# Patient Record
Sex: Male | Born: 1990 | Race: Black or African American | Hispanic: No | Marital: Single | Smoking: Current some day smoker
Health system: Southern US, Community
[De-identification: ages and names within clinical notes are randomized; demographics above are authoritative.]

## PROBLEM LIST (undated history)

## (undated) DIAGNOSIS — G8929 Other chronic pain: Secondary | ICD-10-CM

## (undated) HISTORY — PX: SKIN BIOPSY: SHX1

---
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
## 2013-09-11 ENCOUNTER — Emergency Department (HOSPITAL_BASED_OUTPATIENT_CLINIC_OR_DEPARTMENT_OTHER)
Admission: EM | Admit: 2013-09-11 | Discharge: 2013-09-11 | Disposition: A | Discharge status: Home / Self Care | Payer: Medicaid Other | Attending: Emergency Medicine | Admitting: Emergency Medicine

## 2013-09-11 ENCOUNTER — Encounter (HOSPITAL_BASED_OUTPATIENT_CLINIC_OR_DEPARTMENT_OTHER): Payer: Self-pay | Admitting: Emergency Medicine

## 2013-09-11 ENCOUNTER — Emergency Department (HOSPITAL_BASED_OUTPATIENT_CLINIC_OR_DEPARTMENT_OTHER): Payer: Medicaid Other

## 2013-09-11 DIAGNOSIS — F17200 Nicotine dependence, unspecified, uncomplicated: Secondary | ICD-10-CM | POA: Insufficient documentation

## 2013-09-11 DIAGNOSIS — R0602 Shortness of breath: Secondary | ICD-10-CM | POA: Insufficient documentation

## 2013-09-11 MED ORDER — ALBUTEROL SULFATE HFA 108 (90 BASE) MCG/ACT IN AERS
2.0000 | INHALATION_SPRAY | Freq: Once | RESPIRATORY_TRACT | Status: AC
  Administered 2013-09-11: 2 via RESPIRATORY_TRACT
  Filled 2013-09-11: qty 6.7

## 2013-09-11 NOTE — ED Provider Notes (Signed)
Medical screening examination/treatment/procedure(s) were performed by non-physician practitioner and as supervising physician I was immediately available for consultation/collaboration.     Geoffery Lyonsouglas Janeese Mcgloin, MD 09/11/13 2330

## 2013-09-11 NOTE — ED Provider Notes (Signed)
CSN: [id number]     Arrival date & time 09/11/13  1554 History   First MD Initiated Contact with Patient 09/11/13 1615     Chief Complaint  Patient presents with  . Shortness of Breath     (Consider location/radiation/quality/duration/timing/severity/associated sxs/prior Treatment) HPI Comments: Patient is a 23 year old male with no significant past medical history who presents to the emergency department for shortness of breath x5 months. Patient states that he feels most short of breath when he is exerting himself. 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 Patient states the shortness of breath is improved with rest. He denies associated fever, hemoptysis, cough, chest pain, nausea or vomiting, abdominal pain, numbness/tingling, weakness, recent surgeries or hospitalizations, prolonged travel, history of DVT/PE, and history of coagulopathies. Patient smokes blacken mild cigarettes x 1 year - 0.5 pack year history.  The history is provided by the patient. No language interpreter was used.    History reviewed. No pertinent past medical history. History reviewed. No pertinent past surgical history. History reviewed. No pertinent family history. History  Substance Use Topics  . Smoking status: Current Every Day Smoker -- 0.50 packs/day    Types: Cigarettes  . Smokeless tobacco: Not on file  . Alcohol Use: Yes    Review of Systems  Respiratory: Positive for shortness of breath.   All other systems reviewed and are negative.      Allergies  Review of patient's allergies indicates no known allergies.  Home Medications   Current Outpatient Rx  Name  Route  Sig  Dispense  Refill  . acetaminophen (TYLENOL) 500 MG tablet   Oral   Take 1,000 mg by mouth every 6 (six) hours as needed. For head ache         . EXPIRED: famotidine (PEPCID) 20 MG tablet   Oral   Take 1 tablet (20 mg total) by mouth 2 (two) times daily.   30 tablet   0   . PRESCRIPTION  MEDICATION   Oral   Take 1 tablet by mouth 2 (two) times daily. Antibiotic          BP 120/72  Pulse 76  Temp(Src) 98.2 F (36.8 C) (Oral)  Resp 16  Ht 6\' 1"  (1.854 m)  Wt 160 lb (72.576 kg)  BMI 21.11 kg/m2  SpO2 100%  Physical Exam  Nursing note and vitals reviewed. Constitutional: He is oriented to person, place, and time. He appears well-developed and well-nourished. No distress.  HENT:  Head: Normocephalic and atraumatic.  Eyes: Conjunctivae and EOM are normal. No scleral icterus.  Neck: Normal range of motion.  Cardiovascular: Normal rate, regular rhythm and normal heart sounds.   Pulmonary/Chest: Effort normal and breath sounds normal. No respiratory distress. He has no wheezes. He has no rales.  No retractions or accessory muscle use. Chest expansion is symmetric.  Abdominal: Soft. He exhibits no distension. There is no tenderness. There is no rebound and no guarding.  Musculoskeletal: Normal range of motion.  Neurological: He is alert and oriented to person, place, and time.  Skin: Skin is warm and dry. No rash noted. He is not diaphoretic. No erythema. No pallor.  Psychiatric: He has a normal mood and affect. His behavior is normal.    ED Course  Procedures (including critical care time) Labs Review Labs Reviewed - No data to display  Imaging Review Dg Chest 2 View  09/11/2013   CLINICAL DATA:  Shortness of Breath  EXAM: CHEST  2 VIEW  COMPARISON:  None.  FINDINGS: The lungs are clear. The heart size and pulmonary vascularity are normal. No pneumothorax. No adenopathy. No bone lesions.  IMPRESSION: No abnormality noted.   Electronically Signed   By: Bretta BangWilliam  Woodruff M.D.   On: 09/11/2013 16:53    EKG Interpretation   None       MDM   Final diagnoses:  Shortness of breath   23 year old male who smokes 0.5 packs per day presents to the emergency department for shortness of breath x5 months. Patient states that shortness of breath is worse with exertion  and improved with rest. He denies associated chest pain or hemarthrosis as well as syncope. Patient is well and nontoxic appearing, hemodynamically stable, and afebrile today. Lungs clear to auscultation bilaterally. He has symmetric chest expansion. No retractions or accessory muscle use noted. He ambulates in ED without hypoxia with oxygen saturations of 100% on room air.  Chest x-ray today is negative for focal consolidation or pneumonia, pneumothorax, pleural effusion, or other acute cardiopulmonary abnormality. Suspect that patient's symptoms may be secondary to smoking as he only began smoking one year ago. Doubt pulmonary embolism as patient without tachycardia, tachypnea, dyspnea, or hypoxia. He has PERC negative. Patient advised to quit smoking. He will be discharged today with an albuterol inhaler. Return precautions provided. Patient agreeable to plan with no unaddressed concerns.   Filed Vitals:   09/11/13 1601 09/11/13 1633  BP: 120/72   Pulse: 56 76  Temp: 98.2 F (36.8 C)   TempSrc: Oral   Resp: 16   Height: 6\' 1"  (1.854 m)   Weight: 160 lb (72.576 kg)   SpO2: 100% 100%       Antony MaduraKelly Daxtyn Rottenberg, PA-C 09/11/13 1719

## 2013-09-11 NOTE — ED Notes (Signed)
Pt c/o SOB x 5 months

## 2013-09-11 NOTE — ED Notes (Signed)
Pa  at bedside. 

## 2013-09-11 NOTE — Discharge Instructions (Signed)
Recommend that you discontinue smoking. Use albuterol inhaler, 2 puffs every 4 hours, as needed for shortness of breath. Followup with the pulmonologist, as needed, if symptoms persist or worsen.  Shortness of Breath Shortness of breath means you have trouble breathing. Shortness of breath needs medical care right away. HOME CARE   Do not smoke.  Avoid being around chemicals or things (paint fumes, dust) that may bother your breathing.  Rest as needed. Slowly begin your normal activities.  Only take medicines as told by your doctor.  Keep all doctor visits as told. GET HELP RIGHT AWAY IF:   Your shortness of breath gets worse.  You feel lightheaded, pass out (faint), or have a cough that is not helped by medicine.  You cough up blood.  You have pain with breathing.  You have pain in your chest, arms, shoulders, or belly (abdomen).  You have a fever.  You cannot walk up stairs or exercise the way you normally do.  You do not get better in the time expected.  You have a hard time doing normal activities even with rest.  You have problems with your medicines.  You have any new symptoms. MAKE SURE YOU:  Understand these instructions.  Will watch your condition.  Will get help right away if you are not doing well or get worse. Document Released: 12/27/2007 Document Revised: 01/09/2012 Document Reviewed: 09/25/2011 [facility] Patient Information 2014 [place], [place].

## 2013-09-22 ENCOUNTER — Ambulatory Visit (INDEPENDENT_AMBULATORY_CARE_PROVIDER_SITE_OTHER): Payer: Self-pay | Admitting: Internal Medicine

## 2013-09-22 ENCOUNTER — Encounter: Payer: Self-pay | Admitting: Internal Medicine

## 2013-09-22 VITALS — BP 112/64 | HR 85 | Temp 98.5°F | Ht 73.0 in | Wt 163.0 lb

## 2013-09-22 DIAGNOSIS — J45909 Unspecified asthma, uncomplicated: Secondary | ICD-10-CM

## 2013-09-22 DIAGNOSIS — F17200 Nicotine dependence, unspecified, uncomplicated: Secondary | ICD-10-CM

## 2013-09-22 MED ORDER — BUDESONIDE-FORMOTEROL FUMARATE 80-4.5 MCG/ACT IN AERO: INHALATION_SPRAY | RESPIRATORY_TRACT | Status: DC

## 2013-09-22 NOTE — Progress Notes (Signed)
   Subjective:    Patient ID: Larry Ross, male    DOB: Jul 15, 1991  MRN: [id number]  HPI  22 yobm smoker unemployed [facility]) with allergies all his life worse in April itching/sneezing self rx with otc but new symptoms of sob since Aug 2014 referred 09/22/2013 to pulmonary clinic  by ER where dx as asthma 09/11/13 and placed on pred taper and prn saba   09/22/2013 1st [facility] office visit/ Langdon Crosson  Chief Complaint  Patient presents with  . Pulmonary Consult    Self referral.  Pt states having DOE since Aug 2014. He gets SOB when plays sports and walking up hill. Using ventolin 1 to 2 times per day.     No obvious  patterns in day to day or daytime variabilty or assoc chronic cough or cp or chest tightness, subjective wheeze overt sinus or hb symptoms. No unusual exp hx or h/o childhood pna/ asthma or knowledge of premature birth.  Sleeping ok without nocturnal  or early am exacerbation  of respiratory  c/o's or need for noct saba. Also denies any obvious fluctuation of symptoms with weather or environmental changes or other aggravating or alleviating factors except as outlined above   Current Medications, Allergies, Complete Past Medical History, Past Surgical History, Family History, and Social History were [facility].           Review of Systems  Constitutional: Negative for fever, chills, activity change, appetite change and unexpected weight change.  HENT: Negative for congestion, dental problem, postnasal drip, rhinorrhea, sneezing, sore throat, trouble swallowing and voice change.   Eyes: Negative for visual disturbance.  Respiratory: Positive for shortness of breath. Negative for cough and choking.   Cardiovascular: Negative for chest pain and leg swelling.  Gastrointestinal: Negative for nausea, vomiting and abdominal pain.  Genitourinary: Negative for difficulty urinating.  Musculoskeletal: Negative for arthralgias.   Skin: Negative for rash.  Psychiatric/Behavioral: Negative for behavioral problems and confusion.       Objective:   Physical Exam  amb bm nad sp saba 2 h prior to OV    Wt Readings from Last 3 Encounters:  09/22/13 163 lb (73.936 kg)  09/11/13 160 lb (72.576 kg)     HEENT: nl dentition, turbinates, and orophanx. Nl external ear canals without cough reflex   NECK :  without JVD/Nodes/TM/ nl carotid upstrokes bilaterally   LUNGS: no acc muscle use, clear to A and P bilaterally without cough on insp or exp maneuvers   CV:  RRR  no s3 or murmur or increase in P2, no edema   ABD:  soft and nontender with nl excursion in the supine position. No bruits or organomegaly, bowel sounds nl  MS:  warm without deformities, calf tenderness, cyanosis or clubbing  SKIN: warm and dry without lesions    NEURO:  alert, approp, no deficits      CXR 09/11/13 No abnormality noted.           Assessment & Plan:

## 2013-09-22 NOTE — Patient Instructions (Signed)
Stop all smoking - breathe clean air   Symbicort 80 Take 2 puffs first thing in am and then another 2 puffs about 12 hours later and if back to normal and no need albuterol ok to reduce to 2 puffs in am  Only use your albuterol as a rescue medication to be used if you can't catch your breath by resting or doing a relaxed purse lip breathing pattern.  - The less you use it, the better it will work when you need it. - Ok to use up to 2 puffs  every 4 hours if you must but call for immediate appointment if use goes up over your usual need - Don't leave home without it !!  (think of it like the spare tire for your car)   Please schedule a follow up office visit in 4 weeks, sooner if needed

## 2013-09-23 DIAGNOSIS — J45909 Unspecified asthma, uncomplicated: Secondary | ICD-10-CM | POA: Insufficient documentation

## 2013-09-23 DIAGNOSIS — F17200 Nicotine dependence, unspecified, uncomplicated: Secondary | ICD-10-CM | POA: Insufficient documentation

## 2013-09-23 NOTE — Assessment & Plan Note (Signed)
I took an extended  opportunity with this patient to outline the consequences of continued cigarette use  in airway disorders based on all the data we have from the multiple national lung health studies (perfomed over decades at millions of dollars in cost)  indicating that smoking cessation, not choice of inhalers or physicians, is the most important aspect of care.   

## 2013-09-23 NOTE — Assessment & Plan Note (Addendum)
DDX of  difficult airways managment all start with A and  include Adherence, Ace Inhibitors, Acid Reflux, Active Sinus Disease, Alpha 1 Antitripsin deficiency, Anxiety masquerading as Airways dz,  ABPA,  allergy(esp in young), Aspiration (esp in elderly), Adverse effects of DPI,  Active smokers, plus two Bs  = Bronchiectasis and Beta blocker use..and one C= CHF  In this case Adherence is the biggest issue and starts with  inability to use HFA effectively and also  understand that SABA treats the symptoms but doesn't get to the underlying problem (inflammation).  I used  the analogy of putting steroid cream on a rash to help explain the meaning of topical therapy and the need to get the drug to the target tissue.    Active smoking the other big concern> discussed under sep a/p   The proper method of use, as well as anticipated side effects, of a metered-dose inhaler are discussed and demonstrated to the patient. Improved effectiveness after extensive coaching during this visit to a level of approximately  75%> Try on symbicort 80 2bid

## 2013-10-20 ENCOUNTER — Ambulatory Visit (INDEPENDENT_AMBULATORY_CARE_PROVIDER_SITE_OTHER): Payer: Self-pay | Admitting: Internal Medicine

## 2013-10-20 ENCOUNTER — Encounter: Payer: Self-pay | Admitting: Internal Medicine

## 2013-10-20 VITALS — BP 110/58 | HR 70 | Temp 98.7°F | Ht 73.0 in | Wt 158.8 lb

## 2013-10-20 DIAGNOSIS — F17200 Nicotine dependence, unspecified, uncomplicated: Secondary | ICD-10-CM

## 2013-10-20 DIAGNOSIS — J45909 Unspecified asthma, uncomplicated: Secondary | ICD-10-CM

## 2013-10-20 MED ORDER — BUDESONIDE-FORMOTEROL FUMARATE 80-4.5 MCG/ACT IN AERO: INHALATION_SPRAY | RESPIRATORY_TRACT | Status: DC

## 2013-10-20 NOTE — Patient Instructions (Addendum)
Try symbicort 80 2 pffs each am and leave off the pm puff if doing great, otherwise maintain on 2 puffs every 12 hours  The key is to stop smoking completely in all forms before smoking completely stops you!   Follow here is as needed or when prescriptions run out

## 2013-10-20 NOTE — Progress Notes (Signed)
Subjective:    Patient ID: Larry Ross, male    DOB: [birth year]  MRN: [id number]    Brief patient profile:  22 yobm smoker unemployed [facility]) with allergies all his life worse in April itching/sneezing self rx with otc but new symptoms of sob since Aug 2014 referred 09/22/2013 to pulmonary clinic  by ER where dx as asthma 09/11/13 and placed on pred taper and prn saba     History of Present Illness  09/22/2013 1st [facility] office visit/ W[facility]  Chief Complaint  Patient presents with  . Pulmonary Consult    Self referral.  Pt states having DOE since Aug 2014. He gets SOB when plays sports and walking up hill. Using ventolin 1 to 2 times per day.   rec  Stop all smoking - breathe clean air  Symbicort 80 Take 2 puffs first thing in am and then another 2 puffs about 12 hours later and if back to normal and no need albuterol ok to reduce to 2 puffs in am Only use your albuterol as a rescue medication      10/20/2013 f/u ov/[facility]ert re: symbicort 80 2bid / still smoking some Chief Complaint  Patient presents with  . Follow-up    Pt states breathing has improved since last OV. Denies cough, SOB and CP.   no need for saba at all    No obvious day to day or daytime variabilty or assoc chronic cough or cp or chest tightness, subjective wheeze overt sinus or hb symptoms. No unusual exp hx or h/o childhood pna/ asthma or knowledge of premature birth.  Sleeping ok without nocturnal  or early am exacerbation  of respiratory  c/o's or need for noct saba. Also denies any obvious fluctuation of symptoms with weather or environmental changes or other aggravating or alleviating factors except as outlined above   Current Medications, Allergies, Complete Past Medical History, Past Surgical History, Family History, and Social History were reviewed in [employer] record.  ROS  The following are not active complaints unless bolded sore throat, dysphagia, dental  problems, itching, sneezing,  nasal congestion or excess/ purulent secretions, ear ache,   fever, chills, sweats, unintended wt loss, pleuritic or exertional cp, hemoptysis,  orthopnea pnd or leg swelling, presyncope, palpitations, heartburn, abdominal pain, anorexia, nausea, vomiting, diarrhea  or change in bowel or urinary habits, change in stools or urine, dysuria,hematuria,  rash, arthralgias, visual complaints, headache, numbness weakness or ataxia or problems with walking or coordination,  change in mood/affect or memory.                        Objective:   Physical Exam  amb bm nad   Wt Readings from Last 3 Encounters:  10/20/13 158 lb 12.8 oz (72.031 kg)  09/22/13 163 lb (73.936 kg)  09/11/13 160 lb (72.576 kg)        HEENT: nl dentition, turbinates, and orophanx. Nl external ear canals without cough reflex   NECK :  without JVD/Nodes/TM/ nl carotid upstrokes bilaterally   LUNGS: no acc muscle use, clear to A and P bilaterally without cough on insp or exp maneuvers   CV:  RRR  no s3 or murmur or increase in P2, no edema   ABD:  soft and nontender with nl excursion in the supine position. No bruits or organomegaly, bowel sounds nl  MS:  warm without deformities, calf tenderness, cyanosis or clubbing  SKIN: warm and  dry without lesions           CXR 09/11/13 No abnormality noted.           Assessment & Plan:

## 2013-10-21 NOTE — Assessment & Plan Note (Signed)

## 2013-10-21 NOTE — Assessment & Plan Note (Signed)
All goals of chronic asthma control met including optimal function and elimination of symptoms with minimal need for rescue therapy.  Contingencies discussed in full including contacting this office immediately if not controlling the symptoms using the rule of two's.    

## 2016-07-11 ENCOUNTER — Emergency Department (HOSPITAL_BASED_OUTPATIENT_CLINIC_OR_DEPARTMENT_OTHER)
Admission: EM | Admit: 2016-07-11 | Discharge: 2016-07-11 | Disposition: A | Discharge status: Home / Self Care | Payer: Commercial Managed Care - HMO | Attending: Emergency Medicine | Admitting: Emergency Medicine

## 2016-07-11 ENCOUNTER — Encounter (HOSPITAL_BASED_OUTPATIENT_CLINIC_OR_DEPARTMENT_OTHER): Payer: Self-pay | Admitting: *Deleted

## 2016-07-11 DIAGNOSIS — L02811 Cutaneous abscess of head [any part, except face]: Secondary | ICD-10-CM

## 2016-07-11 DIAGNOSIS — F17210 Nicotine dependence, cigarettes, uncomplicated: Secondary | ICD-10-CM | POA: Diagnosis not present

## 2016-07-11 MED ORDER — PENTAFLUOROPROP-TETRAFLUOROETH EX AERO
INHALATION_SPRAY | CUTANEOUS | Status: AC
  Administered 2016-07-11: 30
  Filled 2016-07-11: qty 30

## 2016-07-11 MED ORDER — LIDOCAINE-EPINEPHRINE-TETRACAINE (LET) SOLUTION
3.0000 mL | Freq: Once | NASAL | Status: AC
  Administered 2016-07-11: 3 mL via TOPICAL
  Filled 2016-07-11: qty 3

## 2016-07-11 MED ORDER — DOXYCYCLINE HYCLATE 100 MG PO CAPS: 100.0000 mg | ORAL_CAPSULE | Freq: Two times a day (BID) | ORAL | 0 refills | Status: DC

## 2016-07-11 MED ORDER — NAPROXEN 500 MG PO TABS: 500.0000 mg | ORAL_TABLET | Freq: Two times a day (BID) | ORAL | 0 refills | Status: DC

## 2016-07-11 NOTE — ED Triage Notes (Signed)
Abscess to right rear head.  Multiple large abscesses noted.  [facility] to be [facility] without success.

## 2016-07-11 NOTE — Discharge Instructions (Signed)
Keep the area of your abscess covered to help it remain clean and dry. Take doxycycline as prescribed until finished. You may take naproxen as needed for pain. Follow-up with your specialist to ensure proper healing and resolution of symptoms. You may return as needed for new or concerning symptoms.

## 2016-07-11 NOTE — ED Notes (Signed)
ED Provider at bedside. 

## 2016-07-11 NOTE — ED Provider Notes (Signed)
[facility]   CSN: [id number] Arrival date & time: 07/11/16  1937  By signing my name below, I, Larry Ross, attest that this documentation has been prepared under the direction and in the presence of  TRW AutomotiveKelly Bynum Mccullars, PA-C. Electronically Signed: Clovis PuAvnee Ross, ED Scribe. 07/11/16. 9:10 PM.   History   Chief Complaint Chief Complaint  Patient presents with  . Abscess     The history is provided by the patient.  No language interpreter was used.   HPI Comments:  Larry Ross is a 25 y.o. male who presents to the Emergency Department complaining of a worsening abscess to the left side of his posterior head x 1-2 weeks. Pt states the lump was originally about quarter sized. He also reports a headache. Pt states he visited Urgent Care today where they tried to numb it with no relief. He has also applied a warm rag with no relief. Pt denies fevers, any other associated symptoms and any other modifying factors at this time. Pt states he had an abscess to his scalp in October where he saw a specialist who performed an I&D.    History reviewed. No pertinent past medical history.   Patient Active Problem List   Diagnosis Date Noted  . 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 Smoker 09/23/2013    History reviewed. No pertinent surgical history.   Home Medications    Prior to Admission medications   Medication Sig Start Date End Date Taking? Authorizing Provider  albuterol (VENTOLIN HFA) 108 (90 BASE) MCG/ACT inhaler Inhale 2 puffs into the lungs every 4 (four) hours as needed for wheezing or shortness of breath.    Historical Provider, MD  budesonide-formoterol (SYMBICORT) 80-4.5 MCG/ACT inhaler Take 2 puffs first thing in am and then another 2 puffs about 12 hours later. 10/20/13   Nyoka CowdenMichael B Wert, MD  doxycycline (VIBRAMYCIN) 100 MG capsule Take 1 capsule (100 mg total) by mouth 2 (two) times daily. 07/11/16   Antony MaduraKelly Chelsei Mcchesney, PA-C  naproxen (NAPROSYN) 500 MG tablet Take  1 tablet (500 mg total) by mouth 2 (two) times daily. 07/11/16   Antony MaduraKelly Talli Kimmer, PA-C    Family History Family History  Problem Relation Age of Onset  . Asthma Neg Hx     Social History Social History  Substance Use Topics  . Smoking status: Current Some Day Smoker    Packs/day: 0.25    Years: 0.50    Types: Cigarettes  . Smokeless tobacco: Never Used     Comment: hookah occ   . Alcohol use No     Allergies   Patient has no known allergies.   Review of Systems Review of Systems 10 systems reviewed and all are negative for acute change except as noted in the HPI.   Physical Exam Updated Vital Signs BP 115/69 (BP Location: Right Arm)   Pulse 66   Temp 98.1 F (36.7 C) (Oral)   Resp 16   Ht 6\' 1"  (1.854 m)   Wt 76.7 kg   SpO2 99%   BMI 22.30 kg/m   Physical Exam  Constitutional: He is oriented to person, place, and time. He appears well-developed and well-nourished. No distress.  Nontoxic and in NAD  HENT:  Head: Normocephalic and atraumatic.    Eyes: Conjunctivae and EOM are normal. No scleral icterus.  Neck: Normal range of motion.  No nuchal rigidity or meningismus  Pulmonary/Chest: Effort normal. No respiratory distress.  Respirations even and unlabored  Musculoskeletal: Normal range of  motion.  Neurological: He is alert and oriented to person, place, and time. He exhibits normal muscle tone. Coordination normal.  Skin: Skin is warm and dry. No rash noted. He is not diaphoretic. No erythema. No pallor.  Psychiatric: He has a normal mood and affect. His behavior is normal.  Nursing note and vitals reviewed.    ED Treatments / Results  DIAGNOSTIC STUDIES:  Oxygen Saturation is 100% on RA, normal by my interpretation.    COORDINATION OF CARE:  9:09 PM Discussed treatment plan with pt at bedside and pt agreed to plan.       Labs (all labs ordered are listed, but only abnormal results are displayed) Labs Reviewed - No data to display  EKG   EKG Interpretation None       Radiology No results found.  Procedures Procedures (including critical care time)  Medications Ordered in ED Medications  lidocaine-EPINEPHrine-tetracaine (LET) solution (3 mLs Topical Given 07/11/16 2156)  pentafluoroprop-tetrafluoroeth (GEBAUERS) aerosol (30 application  Given 07/11/16 2156)    INCISION AND DRAINAGE Performed by: Antony MaduraHUMES, Brieonna Crutcher Consent: Verbal consent obtained. Risks and benefits: risks, benefits and alternatives were discussed Type: abscess  Body area: left parietal scalp  Anesthesia: local infiltration  Incision was made with a scalpel.  Local anesthetic: topical LET  Anesthetic total: 3 ml  Complexity: complex Blunt dissection to break up loculations  Drainage: purulent and bloody  Drainage amount: small  Packing material: none  Patient tolerance: Patient tolerated the procedure well with no immediate complications.     Initial Impression / Assessment and Plan / ED Course  I have reviewed the triage vital signs and the nursing notes.  Pertinent labs & imaging results that were available during my care of the patient were reviewed by me and considered in my medical decision making (see chart for details).  Clinical Course     Patient with skin abscess amenable to incision and drainage. Suspect infected sebaceous cyst. Abscess was not large enough to warrant packing or drain. Wound recheck in 2 days advised. Encouraged home warm soaks and flushing. Mild signs of cellulitis is surrounding skin. No nuchal rigidity or meningismus. Will d/c to home with Rx for doxycycline given sensitive location of abscess. Return precautions discussed and provided. Patient discharged in stable condition with no unaddressed concerns.   Final Clinical Impressions(s) / ED Diagnoses   Final diagnoses:  Abscess, scalp    New Prescriptions Discharge Medication List as of 07/11/2016 10:42 PM    START taking these medications    Details  doxycycline (VIBRAMYCIN) 100 MG capsule Take 1 capsule (100 mg total) by mouth 2 (two) times daily., Starting Tue 07/11/2016, Print    naproxen (NAPROSYN) 500 MG tablet Take 1 tablet (500 mg total) by mouth 2 (two) times daily., Starting Tue 07/11/2016, Print        I personally performed the services described in this documentation, which was scribed in my presence. The recorded information has been reviewed and is accurate.      Antony MaduraKelly Seyed Heffley, PA-C 07/11/16 2323  Melene Planan Floyd, DO 07/12/16 2121

## 2017-06-11 ENCOUNTER — Other Ambulatory Visit: Payer: Self-pay

## 2017-06-11 ENCOUNTER — Encounter (HOSPITAL_BASED_OUTPATIENT_CLINIC_OR_DEPARTMENT_OTHER): Payer: Self-pay | Admitting: *Deleted

## 2017-06-11 ENCOUNTER — Emergency Department (HOSPITAL_BASED_OUTPATIENT_CLINIC_OR_DEPARTMENT_OTHER)
Admission: EM | Admit: 2017-06-11 | Discharge: 2017-06-11 | Disposition: A | Discharge status: Home / Self Care | Payer: 59 | Attending: Emergency Medicine | Admitting: Emergency Medicine

## 2017-06-11 DIAGNOSIS — F17210 Nicotine dependence, cigarettes, uncomplicated: Secondary | ICD-10-CM | POA: Diagnosis not present

## 2017-06-11 DIAGNOSIS — M5431 Sciatica, right side: Secondary | ICD-10-CM | POA: Insufficient documentation

## 2017-06-11 DIAGNOSIS — M545 Low back pain: Secondary | ICD-10-CM | POA: Diagnosis present

## 2017-06-11 DIAGNOSIS — Z79899 Other long term (current) drug therapy: Secondary | ICD-10-CM | POA: Diagnosis not present

## 2017-06-11 DIAGNOSIS — J45998 Other asthma: Secondary | ICD-10-CM | POA: Diagnosis not present

## 2017-06-11 MED ORDER — KETOROLAC TROMETHAMINE 30 MG/ML IJ SOLN
30.0000 mg | Freq: Once | INTRAMUSCULAR | Status: AC
Start: 2017-06-11 — End: 2017-06-11
  Administered 2017-06-11: 30 mg via INTRAMUSCULAR
  Filled 2017-06-11: qty 1

## 2017-06-11 MED ORDER — DEXAMETHASONE SODIUM PHOSPHATE 10 MG/ML IJ SOLN
10.0000 mg | Freq: Once | INTRAMUSCULAR | Status: AC
  Administered 2017-06-11: 10 mg via INTRAMUSCULAR
  Filled 2017-06-11: qty 1

## 2017-06-11 MED ORDER — IBUPROFEN 800 MG PO TABS: 800.0000 mg | ORAL_TABLET | Freq: Three times a day (TID) | ORAL | 0 refills | Status: DC

## 2017-06-11 MED ORDER — CYCLOBENZAPRINE HCL 10 MG PO TABS: 5.0000 mg | ORAL_TABLET | Freq: Two times a day (BID) | ORAL | 0 refills | Status: DC | PRN

## 2017-06-11 NOTE — ED Provider Notes (Signed)
[facility] EMERGENCY DEPARTMENT Provider Note   CSN: [id number] Arrival date & time: 06/11/17  1233   History   Chief Complaint Chief Complaint  Patient presents with  . Back Pain    HPI Larry Ross is a 26 y.o. male.  HPI   Patient to the ER with complaints of right sided back/hip pain. He was working out doing low back and leg work outs when the next morning his hip/low back was sore. It has progressively worsened to the point where it is hard for him to pend over to pull up or down his pants due to pain. The pain goes from the low back and down right behind his right knee. He has not tried anything at home. Denies any specific injury. Denies fevers, hx of IDU, change in bowel or urine control. Denies weakness or numbness.  History reviewed. No pertinent past medical history.  Patient Active Problem List   Diagnosis Date Noted  . 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 Smoker 09/23/2013    History reviewed. No pertinent surgical history.     Home Medications    Prior to Admission medications   Medication Sig Start Date End Date Taking? Authorizing Provider  albuterol (VENTOLIN HFA) 108 (90 BASE) MCG/ACT inhaler Inhale 2 puffs into the lungs every 4 (four) hours as needed for wheezing or shortness of breath.    [provider]  budesonide-formoterol (SYMBICORT) 80-4.5 MCG/ACT inhaler Take 2 puffs first thing in am and then another 2 puffs about 12 hours later. 10/20/13   Nyoka CowdenWert, Michael B, MD  cyclobenzaprine (FLEXERIL) 10 MG tablet Take 0.5-1 tablets (5-10 mg total) 2 (two) times daily as needed by mouth for muscle spasms. 06/11/17   Marlon PelGreene, Deryn Massengale, PA-C  doxycycline (VIBRAMYCIN) 100 MG capsule Take 1 capsule (100 mg total) by mouth 2 (two) times daily. 07/11/16   Antony MaduraHumes, Kelly, PA-C  ibuprofen (ADVIL,MOTRIN) 800 MG tablet Take 1 tablet (800 mg total) 3 (three) times daily by mouth. 06/11/17   Neva SeatGreene, Rhyatt Muska, PA-C  naproxen (NAPROSYN) 500 MG tablet Take 1  tablet (500 mg total) by mouth 2 (two) times daily. 07/11/16   Antony MaduraHumes, Kelly, PA-C    Family History Family History  Problem Relation Age of Onset  . Asthma Neg Hx     Social History Social History   Tobacco Use  . Smoking status: Current Some Day Smoker    Packs/day: 0.25    Years: 0.50    Pack years: 0.12    Types: Cigarettes  . Smokeless tobacco: Never Used  . Tobacco comment: hookah occ   Substance Use Topics  . Alcohol use: No  . Drug use: No     Allergies   Patient has no known allergies.   Review of Systems Review of Systems Negative ROS aside from pertinent positives and negatives as listed in HPI   Physical Exam Updated Vital Signs BP (!) 100/56   Pulse 87   Temp 98.3 F (36.8 C) (Oral)   Resp 16   Ht 6\' 1"  (1.854 m)   Wt 74.8 kg (165 lb)   SpO2 99%   BMI 21.77 kg/m   Physical Exam  Constitutional: He appears well-developed and well-nourished. No distress.  HENT:  Head: Normocephalic and atraumatic.  Eyes: Pupils are equal, round, and reactive to light.  Neck: Normal range of motion. Neck supple.  Cardiovascular: Normal rate and regular rhythm.  Pulmonary/Chest: Effort normal.  Abdominal: Soft.  Musculoskeletal:       Lumbar  back: He exhibits decreased range of motion, tenderness, pain and spasm.       Back:  Reflexes are physiologic and symmetrical bilaterally.  +normal gait  Neurological: He is alert.  Skin: Skin is warm and dry.  Nursing note and vitals reviewed.   ED Treatments / Results  Labs (all labs ordered are listed, but only abnormal results are displayed) Labs Reviewed - No data to display  EKG  EKG Interpretation None       Radiology No results found.  Procedures Procedures (including critical care time)  Medications Ordered in ED Medications  ketorolac (TORADOL) 30 MG/ML injection 30 mg (not administered)  dexamethasone (DECADRON) injection 10 mg (not administered)     Initial Impression / Assessment  and Plan / ED Course  I have reviewed the triage vital signs and the nursing notes.  Pertinent labs & imaging results that were available during my care of the patient were reviewed by me and considered in my medical decision making (see chart for details).     No red flags concerning patient's back pain. No s/s of central cord compression or cauda equina. Lower extremities are neurovascularly intact and patient is ambulating without difficulty.  No red flag s/s of low back pain. Patient was counseled on back pain precautions and told to do activity as tolerated but do not lift, push, or pull heavy objects more than 10 pounds for the next week. Patient counseled to use ice or heat on back for no longer than 15 minutes every hour.   Patient prescribed muscle relaxer and counseled on proper use of muscle relaxant medication.  Urged patient not to drink alcohol, drive, or perform any other activities that requires focus while taking either of these medications.   Patient urged to follow-up with PCP if pain does not improve with treatment and rest or if pain becomes recurrent. Urged to return with worsening severe pain, loss of bowel or bladder control, trouble walking.   The patient verbalizes understanding and agrees with the plan.   Final Clinical Impressions(s) / ED Diagnoses   Final diagnoses:  Sciatica of right side    ED Discharge Orders        Ordered    cyclobenzaprine (FLEXERIL) 10 MG tablet  2 times daily PRN     06/11/17 1251    ibuprofen (ADVIL,MOTRIN) 800 MG tablet  3 times daily     06/11/17 1251       Marlon PelGreene, Mashelle Busick, PA-C 06/11/17 1258    Margarita Grizzleay, Danielle, MD 06/11/17 [phone]

## 2017-06-11 NOTE — ED Triage Notes (Signed)
2 weeks ago he was working out and has been stiff in his lower back and into is right leg since.

## 2018-03-01 ENCOUNTER — Emergency Department (HOSPITAL_BASED_OUTPATIENT_CLINIC_OR_DEPARTMENT_OTHER)
Admission: EM | Admit: 2018-03-01 | Discharge: 2018-03-01 | Disposition: A | Discharge status: Home / Self Care | Payer: 59 | Attending: Emergency Medicine | Admitting: Emergency Medicine

## 2018-03-01 ENCOUNTER — Other Ambulatory Visit: Payer: Self-pay

## 2018-03-01 DIAGNOSIS — Z79899 Other long term (current) drug therapy: Secondary | ICD-10-CM | POA: Diagnosis not present

## 2018-03-01 DIAGNOSIS — M79605 Pain in left leg: Secondary | ICD-10-CM | POA: Diagnosis present

## 2018-03-01 DIAGNOSIS — M5432 Sciatica, left side: Secondary | ICD-10-CM | POA: Diagnosis not present

## 2018-03-01 DIAGNOSIS — J45909 Unspecified asthma, uncomplicated: Secondary | ICD-10-CM | POA: Insufficient documentation

## 2018-03-01 DIAGNOSIS — F17210 Nicotine dependence, cigarettes, uncomplicated: Secondary | ICD-10-CM | POA: Insufficient documentation

## 2018-03-01 MED ORDER — DEXAMETHASONE SODIUM PHOSPHATE 10 MG/ML IJ SOLN
10.0000 mg | Freq: Once | INTRAMUSCULAR | Status: AC
  Administered 2018-03-01: 10 mg via INTRAMUSCULAR
  Filled 2018-03-01: qty 1

## 2018-03-01 MED ORDER — OXYCODONE-ACETAMINOPHEN 5-325 MG PO TABS
1.0000 | ORAL_TABLET | Freq: Once | ORAL | Status: AC
  Administered 2018-03-01: 1 via ORAL
  Filled 2018-03-01: qty 1

## 2018-03-01 MED ORDER — TRAMADOL HCL 50 MG PO TABS: 50.0000 mg | ORAL_TABLET | Freq: Four times a day (QID) | ORAL | 0 refills | Status: DC | PRN

## 2018-03-01 MED ORDER — PREDNISONE 10 MG (21) PO TBPK: ORAL_TABLET | ORAL | 0 refills | Status: AC

## 2018-03-01 MED ORDER — IBUPROFEN 800 MG PO TABS: 800.0000 mg | ORAL_TABLET | Freq: Four times a day (QID) | ORAL | 0 refills | Status: DC | PRN

## 2018-03-01 NOTE — ED Triage Notes (Signed)
Pt reports 10/10 left leg pain that starts in his buttocks and radiates down his leg. Pt has hx of right leg sciatica. Pt denies recent injury or fall.

## 2018-03-01 NOTE — ED Provider Notes (Signed)
[facility] EMERGENCY DEPARTMENT Provider Note   CSN: [id number] Arrival date & time: 03/01/18  0359     History   Chief Complaint Chief Complaint  Patient presents with  . Leg Pain    Left    HPI Larry Ross is a 27 y.o. male.  Patient presents to the emergency department for evaluation of leg and back pain.  Patient reports that symptoms began 2 or 3 days ago.  Pain started as an aching pain behind the left hip and in the lower back.  He is now expensing pain radiating down his leg.  Pain worsens with movement at the hip.  He denies any injury.  He has had similar pain on the right side, treated as sciatica.  No loss of bowel or bladder function.  No numbness, tingling or weakness of the lower extremities.     No past medical history on file.  Patient Active Problem List   Diagnosis Date Noted  . 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 Smoker 09/23/2013    No past surgical history on file.      Home Medications    Prior to Admission medications   Medication Sig Start Date End Date Taking? Authorizing Provider  albuterol (VENTOLIN HFA) 108 (90 BASE) MCG/ACT inhaler Inhale 2 puffs into the lungs every 4 (four) hours as needed for wheezing or shortness of breath.    [provider]  budesonide-formoterol (SYMBICORT) 80-4.5 MCG/ACT inhaler Take 2 puffs first thing in am and then another 2 puffs about 12 hours later. 10/20/13   Nyoka Cowden, MD  ibuprofen (ADVIL,MOTRIN) 800 MG tablet Take 1 tablet (800 mg total) by mouth every 6 (six) hours as needed. 03/01/18   Gilda Crease, MD  predniSONE (STERAPRED UNI-PAK 21 TAB) 10 MG (21) TBPK tablet Take 6 tablets (60 mg total) by mouth daily for 1 day, THEN 5 tablets (50 mg total) daily for 1 day, THEN 4 tablets (40 mg total) daily for 1 day, THEN 3 tablets (30 mg total) daily for 1 day, THEN 2 tablets (20 mg total) daily for 1 day, THEN 1 tablet (10 mg total) daily for 1 day. 03/01/18 03/07/18  Gilda Crease, MD  traMADol (ULTRAM) 50 MG tablet Take 1 tablet (50 mg total) by mouth every 6 (six) hours as needed. 03/01/18   Gilda Crease, MD    Family History Family History  Problem Relation Age of Onset  . Asthma Neg Hx     Social History Social History   Tobacco Use  . Smoking status: Current Some Day Smoker    Packs/day: 0.25    Years: 0.50    Pack years: 0.12    Types: Cigarettes  . Smokeless tobacco: Never Used  . Tobacco comment: hookah occ   Substance Use Topics  . Alcohol use: No  . Drug use: No     Allergies   Patient has no known allergies.   Review of Systems Review of Systems  Musculoskeletal: Positive for back pain.  All other systems reviewed and are negative.    Physical Exam Updated Vital Signs BP 137/82 (BP Location: Right Arm)   Pulse 65   Temp 98.2 F (36.8 C) (Oral)   Resp 16   SpO2 100%   Physical Exam  Constitutional: He appears well-developed and well-nourished.  HENT:  Head: Atraumatic.  Pulmonary/Chest: Effort normal.  Musculoskeletal: He exhibits no edema or deformity.       Left hip: He exhibits  decreased range of motion (increased pain with flexion at hip). He exhibits no tenderness.       Lumbar back: He exhibits tenderness.       Back:  Neurological:  Reflex Scores:      Patellar reflexes are 1+ on the right side and 1+ on the left side.    ED Treatments / Results  Labs (all labs ordered are listed, but only abnormal results are displayed) Labs Reviewed - No data to display  EKG None  Radiology No results found.  Procedures Procedures (including critical care time)  Medications Ordered in ED Medications  dexamethasone (DECADRON) injection 10 mg (has no administration in time range)  oxyCODONE-acetaminophen (PERCOCET/ROXICET) 5-325 MG per tablet 1 tablet (has no administration in time range)     Initial Impression / Assessment and Plan / ED Course  I have reviewed the triage vital signs and  the nursing notes.  Pertinent labs & imaging results that were available during my care of the patient were reviewed by me and considered in my medical decision making (see chart for details).     Patient presents to the ER with musculoskeletal back pain. Examination reveals back tenderness without any associated neurologic findings. Patient's strength, sensation and reflexes were normal. There is no evidence of saddle anesthesia. Patient does not have a foot drop. Patient has not experienced any change in bowel or bladder function. As such, patient did not require any imaging or further studies. Patient was treated with analgesia.  Final Clinical Impressions(s) / ED Diagnoses   Final diagnoses:  Sciatica of left side    ED Discharge Orders         Ordered    traMADol (ULTRAM) 50 MG tablet  Every 6 hours PRN     03/01/18 0423    ibuprofen (ADVIL,MOTRIN) 800 MG tablet  Every 6 hours PRN     03/01/18 0423    predniSONE (STERAPRED UNI-PAK 21 TAB) 10 MG (21) TBPK tablet     03/01/18 0423           Gilda CreasePollina, Elaisha Zahniser J, MD 03/01/18 [phone]

## 2018-03-09 ENCOUNTER — Other Ambulatory Visit: Payer: Self-pay

## 2018-03-09 ENCOUNTER — Encounter (HOSPITAL_BASED_OUTPATIENT_CLINIC_OR_DEPARTMENT_OTHER): Payer: Self-pay | Admitting: Emergency Medicine

## 2018-03-09 ENCOUNTER — Emergency Department (HOSPITAL_BASED_OUTPATIENT_CLINIC_OR_DEPARTMENT_OTHER)
Admission: EM | Admit: 2018-03-09 | Discharge: 2018-03-09 | Disposition: A | Discharge status: Home / Self Care | Payer: 59 | Attending: Emergency Medicine | Admitting: Emergency Medicine

## 2018-03-09 DIAGNOSIS — G8929 Other chronic pain: Secondary | ICD-10-CM

## 2018-03-09 DIAGNOSIS — Z79899 Other long term (current) drug therapy: Secondary | ICD-10-CM | POA: Insufficient documentation

## 2018-03-09 DIAGNOSIS — J45909 Unspecified asthma, uncomplicated: Secondary | ICD-10-CM | POA: Diagnosis not present

## 2018-03-09 DIAGNOSIS — M5442 Lumbago with sciatica, left side: Secondary | ICD-10-CM | POA: Diagnosis not present

## 2018-03-09 DIAGNOSIS — M545 Low back pain: Secondary | ICD-10-CM | POA: Diagnosis present

## 2018-03-09 DIAGNOSIS — F17210 Nicotine dependence, cigarettes, uncomplicated: Secondary | ICD-10-CM | POA: Diagnosis not present

## 2018-03-09 MED ORDER — PREDNISONE 10 MG PO TABS: 40.0000 mg | ORAL_TABLET | Freq: Every day | ORAL | 0 refills | Status: AC

## 2018-03-09 MED ORDER — CYCLOBENZAPRINE HCL 10 MG PO TABS: 10.0000 mg | ORAL_TABLET | Freq: Two times a day (BID) | ORAL | 0 refills | Status: DC | PRN

## 2018-03-09 NOTE — ED Provider Notes (Signed)
[facility] EMERGENCY DEPARTMENT Provider Note   CSN: [id number] Arrival date & time: 03/09/18  1117     History   Chief Complaint Chief Complaint  Patient presents with  . Back Pain    HPI Larry Ross is a 27 y.o. male here for evaluation of back pain that he attributes to his sciatica.  Pain began 2 days ago.  Pain is moderate, sometimes severe with movement and palpation.  Pain begins at the top of his left buttocks and radiates down the posterior aspect of his leg to about the knee.  Exacerbated with movement, sitting down, bending at the waist.  Associated with intermittent tingling down his leg with weightbearing.  Patient states that this pain is just like his previous bouts of sciatica.  States he does a lot of exertional activity and lifting at work but does not remember any specific injury leading up to the symptoms.  No recent falls.  Was seen in the ER on 8/9 for the same and was discharged with prednisone and tramadol, states that these medicines helped but the pain just came right back.  He denies any fevers, chills, abdominal pain, urinary symptoms, changes in bowel movements, groin numbness, loss of bladder or bowel control, loss of sensation, heaviness or weakness to his extremities.  No IV drug use.  No interventions. No alleviating factors.   HPI  History reviewed. No pertinent past medical history.  Patient Active Problem List   Diagnosis Date Noted  . 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 Smoker 09/23/2013    History reviewed. No pertinent surgical history.      Home Medications    Prior to Admission medications   Medication Sig Start Date End Date Taking? Authorizing Provider  albuterol (VENTOLIN HFA) 108 (90 BASE) MCG/ACT inhaler Inhale 2 puffs into the lungs every 4 (four) hours as needed for wheezing or shortness of breath.    [provider]  budesonide-formoterol (SYMBICORT) 80-4.5 MCG/ACT inhaler Take 2 puffs first thing in am and  then another 2 puffs about 12 hours later. 10/20/13   Nyoka CowdenWert, Michael B, MD  cyclobenzaprine (FLEXERIL) 10 MG tablet Take 1 tablet (10 mg total) by mouth 2 (two) times daily as needed for muscle spasms. 03/09/18   Liberty HandyGibbons, Inza Mikrut J, PA-C  ibuprofen (ADVIL,MOTRIN) 800 MG tablet Take 1 tablet (800 mg total) by mouth every 6 (six) hours as needed. 03/01/18   Gilda CreasePollina, Christopher J, MD  predniSONE (DELTASONE) 10 MG tablet Take 4 tablets (40 mg total) by mouth daily for 5 days. 03/09/18 03/14/18  Liberty HandyGibbons, Mandrell Vangilder J, PA-C  traMADol (ULTRAM) 50 MG tablet Take 1 tablet (50 mg total) by mouth every 6 (six) hours as needed. 03/01/18   Gilda CreasePollina, Christopher J, MD    Family History Family History  Problem Relation Age of Onset  . Asthma Neg Hx     Social History Social History   Tobacco Use  . Smoking status: Current Some Day Smoker    Packs/day: 0.25    Years: 0.50    Pack years: 0.12    Types: Cigarettes  . Smokeless tobacco: Never Used  . Tobacco comment: hookah occ   Substance Use Topics  . Alcohol use: No  . Drug use: No     Allergies   Patient has no known allergies.   Review of Systems Review of Systems  Musculoskeletal: Positive for back pain.  All other systems reviewed and are negative.    Physical Exam Updated Vital Signs BP  110/75 (BP Location: Right Arm)   Pulse 81   Temp 98.5 F (36.9 C) (Oral)   Resp 18   Ht 6' (1.829 m)   Wt 72.6 kg   SpO2 100%   BMI 21.70 kg/m   Physical Exam  Constitutional: He appears well-developed and well-nourished.  HENT:  Head: Normocephalic and atraumatic.  Nose: Nose normal.  Eyes: EOM are normal.  Neck: Normal range of motion. Neck supple.  No midline cervical spine tenderness  Cardiovascular: Normal rate, S1 normal, S2 normal and normal heart sounds.  Pulses:      Radial pulses are 2+ on the right side, and 2+ on the left side.       Dorsalis pedis pulses are 2+ on the right side, and 2+ on the left side.  Pulmonary/Chest:  Effort normal and breath sounds normal. He has no decreased breath sounds.  Abdominal: Soft. Normal appearance and bowel sounds are normal. There is no tenderness.  No suprapubic or CVA tenderness   Musculoskeletal: He exhibits tenderness.       Lumbar back: He exhibits tenderness and pain.  No T spine midline tenderness No L spine midline tenderness No T/L spine paraspinal muscular tenderness or increased tone  +R sciatic notches tenderness, diffuse LEFT buttock tenderness. Positive LEFT SLR. Positive contralateral SLR. Negative Pearlean Brownie and Stinchfield tests bilaterally  Neurological:  5/5 strength with flexion/extension of hip, knee and ankle, bilaterally.  Sensation to light touch intact in lower extremities including feet  Skin: Skin is warm and dry. Capillary refill takes less than 2 seconds.  Psychiatric: He has a normal mood and affect. His behavior is normal. Judgment and thought content normal.     ED Treatments / Results  Labs (all labs ordered are listed, but only abnormal results are displayed) Labs Reviewed - No data to display  EKG None  Radiology No results found.  Procedures Procedures (including critical care time)  Medications Ordered in ED Medications - No data to display   Initial Impression / Assessment and Plan / ED Course  I have reviewed the triage vital signs and the nursing notes.  Pertinent labs & imaging results that were available during my care of the patient were reviewed by me and considered in my medical decision making (see chart for details).     Patient with a hx of back pain here with flare of sciatica.  States symptoms today similar to previous sciatica.  Seen on 8/9 in ER for same.    Musculoskeletal exam revealed reproducible tenderness @ left sciatic notch, positive SLR suggesting MSK/radicular etiology. Abdominal exam benign, without pulsatility, suprapubic or CVA tenderness. Distal pulses symmetric bilaterally. No focal neurological  deficits. No overlaying rash. Considered UTI/pyelo, kidney stone, cauda equina, epidural abscess, dissection considered but these don't fit clinical picture. No red flag features of back pain present such as saddle anesthesia, bladder/bowel incontinence or retention, fevers, h/o cancer, IVDU, preceding trauma or falls, unilateral weakness, urinary symptoms. Emergent lab/imaging not thought to be indicated today as physical exam reassuring.  Conservative measures such as ice/heat, mild stretches, muscle relaxer and high dose NSAIDs indicated with PCP follow-up if no improvement with conservative management. ED return precautions discussed with patient who verbalized understanding and is agreeable to plan. Pt has frequent ER visits for back pain. Discussed importance of f/u with ortho as recurrent use of prednisone, NSAID not recommended.    Final Clinical Impressions(s) / ED Diagnoses   Final diagnoses:  Chronic left-sided low back pain with  left-sided sciatica    ED Discharge Orders         Ordered    predniSONE (DELTASONE) 10 MG tablet  Daily     03/09/18 1229    cyclobenzaprine (FLEXERIL) 10 MG tablet  2 times daily PRN     03/09/18 1229           Liberty HandyGibbons, Pawan Knechtel J, PA-C 03/09/18 1236    Loren RacerYelverton, David, MD 03/12/18 1721

## 2018-03-09 NOTE — ED Triage Notes (Signed)
Patient states " my sciatic nerve is acting up real bad" - patient states that he has left lower back pain

## 2018-03-09 NOTE — Discharge Instructions (Addendum)
Given your symptoms and physical exam findings, I suspect you have muscular spasm due to overuse/recent activity and/or sciatic nerve inflammation.   We will treat your inflammation with the following medication regimen: Prednisone 40 mg daily x 5 days Flexeril 10 mg x 5 days Ibuprofen 600 mg + Acetaminophen 1000 mg every 8 hours for the next 5 days Heating pad as needed Over the counter lidocaine patches (salonpas) can be helpful  You need follow up with orthopedist for chronic back pain and sciatica.  Recurrent use of ibuprofen, acetaminophen, prednisone is not recommended and orthopedist can offer different treatment options.   Avoid any exacerbating activities for the next 48 hours.  After 48 hours, start doing light back range of motion exercises and walking to avoid worsening back stiffness.   Return for fevers, chills, abdominal pain, changes in bowel movement, urinary symptoms, groin numbness, loss of bladder or bowel control, numbness weakness or heaviness to your extremities, rash.

## 2018-03-18 ENCOUNTER — Ambulatory Visit (HOSPITAL_BASED_OUTPATIENT_CLINIC_OR_DEPARTMENT_OTHER)
Admission: RE | Admit: 2018-03-18 | Discharge: 2018-03-18 | Disposition: A | Discharge status: Home / Self Care | Payer: 59 | Source: Ambulatory Visit | Attending: Family Medicine | Admitting: Family Medicine

## 2018-03-18 ENCOUNTER — Ambulatory Visit (INDEPENDENT_AMBULATORY_CARE_PROVIDER_SITE_OTHER): Payer: 59 | Admitting: Family Medicine

## 2018-03-18 ENCOUNTER — Encounter: Payer: Self-pay | Admitting: Family Medicine

## 2018-03-18 VITALS — BP 117/73 | HR 103 | Ht 73.0 in | Wt 160.0 lb

## 2018-03-18 DIAGNOSIS — M545 Low back pain, unspecified: Secondary | ICD-10-CM

## 2018-03-18 DIAGNOSIS — M79605 Pain in left leg: Secondary | ICD-10-CM

## 2018-03-18 MED ORDER — METHOCARBAMOL 500 MG PO TABS: 500.0000 mg | ORAL_TABLET | Freq: Three times a day (TID) | ORAL | 1 refills | Status: DC | PRN

## 2018-03-18 MED ORDER — OXYCODONE-ACETAMINOPHEN 5-325 MG PO TABS: 1.0000 | ORAL_TABLET | Freq: Four times a day (QID) | ORAL | 0 refills | Status: DC | PRN

## 2018-03-18 MED ORDER — DICLOFENAC SODIUM 75 MG PO TBEC: 75.0000 mg | DELAYED_RELEASE_TABLET | Freq: Two times a day (BID) | ORAL | 1 refills | Status: DC

## 2018-03-18 NOTE — Patient Instructions (Addendum)
You have lumbar radiculopathy (a pinched nerve in your low back). Start voltaren 75mg  twice a day with food for pain and inflammation. Percocet as needed for severe pain (no driving on this medicine). Robaxin as needed for muscle spasms (no driving on this medicine if it makes you sleepy). Stay as active as possible. Physical therapy has been shown to be helpful as well. Strengthening of low back muscles, abdominal musculature are key for long term pain relief. Get x-rays downstairs as you leave today. We will set up an MRI of your lumbar spine after the x-rays. Follow up and next steps will depend on the MRI.

## 2018-03-19 ENCOUNTER — Encounter: Payer: Self-pay | Admitting: Family Medicine

## 2018-03-19 NOTE — Addendum Note (Signed)
Addended by: Kathi SimpersWISE, Briyah Wheelwright F on: 03/19/2018 01:04 PM   Modules accepted: Orders

## 2018-03-19 NOTE — Progress Notes (Signed)
PCP: Patient, No Pcp Per  Subjective:   HPI: Patient is a 27 y.o. male here for low back pain.  Patient reports for about 2 months he's had worsening left sided low back pain. Pain level 10/10 and sharp, radiating down left leg with associated cramping. Feels like posterior left hip will lock up on him. No numbness. No bowel/bladder dysfunction. No benefit so far with flexeril, advil, tramadol, prednisone dose packs. No skin changes. Had problems with right side a year ago that resolved.  History reviewed. No pertinent past medical history.  Current Outpatient Medications on File Prior to Visit  Medication Sig Dispense Refill  . albuterol (VENTOLIN HFA) 108 (90 BASE) MCG/ACT inhaler Inhale 2 puffs into the lungs every 4 (four) hours as needed for wheezing or shortness of breath.    . budesonide-formoterol (SYMBICORT) 80-4.5 MCG/ACT inhaler Take 2 puffs first thing in am and then another 2 puffs about 12 hours later. 1 Inhaler 11   No current facility-administered medications on file prior to visit.     History reviewed. No pertinent surgical history.  No Known Allergies  Social History   Socioeconomic History  . Marital status: Single    Spouse name: Not on file  . Number of children: Not on file  . Years of education: Not on file  . Highest education level: Not on file  Occupational History  . Not on file  Social Needs  . Financial resource strain: Not on file  . Food insecurity:    Worry: Not on file    Inability: Not on file  . Transportation needs:    Medical: Not on file    Non-medical: Not on file  Tobacco Use  . Smoking status: Current Some Day Smoker    Packs/day: 0.25    Years: 0.50    Pack years: 0.12    Types: Cigarettes  . Smokeless tobacco: Never Used  . Tobacco comment: hookah occ   Substance and Sexual Activity  . Alcohol use: No  . Drug use: No  . Sexual activity: Not on file  Lifestyle  . Physical activity:    Days per week: Not on file     Minutes per session: Not on file  . Stress: Not on file  Relationships  . Social connections:    Talks on phone: Not on file    Gets together: Not on file    Attends religious service: Not on file    Active member of club or organization: Not on file    Attends meetings of clubs or organizations: Not on file    Relationship status: Not on file  . Intimate partner violence:    Fear of current or ex partner: Not on file    Emotionally abused: Not on file    Physically abused: Not on file    Forced sexual activity: Not on file  Other Topics Concern  . Not on file  Social History Narrative  . Not on file    Family History  Problem Relation Age of Onset  . Asthma Neg Hx     BP 117/73   Pulse (!) 103   Ht 6\' 1"  (1.854 m)   Wt 160 lb (72.6 kg)   BMI 21.11 kg/m   Review of Systems: See HPI above.     Objective:  Physical Exam:  Gen: NAD, comfortable in exam room  Back: No gross deformity, scoliosis. TTP left lumbar paraspinal region, buttock.  No midline or bony TTP. Limited motion  2/2 pain Strength 3/5 left hip flexion, 4+/5 knee extension.  5/5 other LE muscle groups. 2+ MSRs in achilles tendons, trace patellar tendons, equal bilaterally. Positive SLR and cross SLR. Sensation intact to light touch.  Left hip: No deformity. FROM with 5/5 strength except 3/5 hip flexion. Tenderness to palpation left buttock. NVI distally. Negative logroll.   Assessment & Plan:  1. Low back pain with radiation into left leg - consistent with lumbar radiculopathy.  Independently reviewed radiographs and no bony abnormalities.  Noted loss of lordosis consistent with spasms.  Unfortunately he has not improved with steroid dose pack, ibuprofen, tramadol, flexeril.  Will go ahead with MRI to assess for source of radiculopathy given his persistent pain, weakness.  Start voltaren with robaxin and percocet as needed.

## 2018-03-20 ENCOUNTER — Ambulatory Visit: Payer: 59 | Admitting: Family Medicine

## 2018-04-10 ENCOUNTER — Inpatient Hospital Stay: Admission: RE | Admit: 2018-04-10 | Payer: 59 | Source: Ambulatory Visit

## 2018-04-10 ENCOUNTER — Ambulatory Visit
Admission: RE | Admit: 2018-04-10 | Discharge: 2018-04-10 | Disposition: A | Discharge status: Home / Self Care | Payer: 59 | Source: Ambulatory Visit | Attending: Family Medicine | Admitting: Family Medicine

## 2018-04-10 DIAGNOSIS — M79605 Pain in left leg: Principal | ICD-10-CM

## 2018-04-10 DIAGNOSIS — M545 Low back pain: Secondary | ICD-10-CM

## 2018-04-17 ENCOUNTER — Ambulatory Visit (INDEPENDENT_AMBULATORY_CARE_PROVIDER_SITE_OTHER): Payer: 59 | Admitting: Family Medicine

## 2018-04-17 VITALS — BP 106/74 | Ht 73.0 in | Wt 160.0 lb

## 2018-04-17 DIAGNOSIS — M5430 Sciatica, unspecified side: Secondary | ICD-10-CM | POA: Diagnosis not present

## 2018-04-17 MED ORDER — TIZANIDINE HCL 4 MG PO TABS: 4.0000 mg | ORAL_TABLET | Freq: Three times a day (TID) | ORAL | 1 refills | Status: DC | PRN

## 2018-04-17 MED ORDER — NAPROXEN 500 MG PO TABS: 500.0000 mg | ORAL_TABLET | Freq: Two times a day (BID) | ORAL | 2 refills | Status: DC | PRN

## 2018-04-17 MED ORDER — NORTRIPTYLINE HCL 25 MG PO CAPS: 25.0000 mg | ORAL_CAPSULE | Freq: Every day | ORAL | 2 refills | Status: AC

## 2018-04-17 NOTE — Patient Instructions (Signed)
You have sciatica. Start physical therapy and do home exercises on days you don't go to therapy. Naproxen 500mg  twice a day with food. Tizanidine as needed for spasms. Nortriptyline at bedtime to help with sleep and nerve pain. Follow up with me in 1 month in [facility] for reevaluation.

## 2018-04-18 ENCOUNTER — Encounter: Payer: Self-pay | Admitting: Family Medicine

## 2018-04-18 NOTE — Progress Notes (Signed)
PCP: Patient, No Pcp Per  Subjective:   HPI: Patient is a 27 y.o. male here for low back pain.  8/26: Patient reports for about 2 months he's had worsening left sided low back pain. Pain level 10/10 and sharp, radiating down left leg with associated cramping. Feels like posterior left hip will lock up on him. No numbness. No bowel/bladder dysfunction. No benefit so far with flexeril, advil, tramadol, prednisone dose packs. No skin changes. Had problems with right side a year ago that resolved.  9/25: Patient reports he continues to have 10/10 level pain left side posterior hip/buttock radiating down leg to about the knee. Diclofenac and percocet both help transiently. No bowel/bladder dysfunction. Robaxin has not helped. No skin changes. No numbness.  History reviewed. No pertinent past medical history.  Current Outpatient Medications on File Prior to Visit  Medication Sig Dispense Refill  . albuterol (VENTOLIN HFA) 108 (90 BASE) MCG/ACT inhaler Inhale 2 puffs into the lungs every 4 (four) hours as needed for wheezing or shortness of breath.    . budesonide-formoterol (SYMBICORT) 80-4.5 MCG/ACT inhaler Take 2 puffs first thing in am and then another 2 puffs about 12 hours later. (Patient not taking: Reported on 04/17/2018) 1 Inhaler 11   No current facility-administered medications on file prior to visit.     History reviewed. No pertinent surgical history.  No Known Allergies  Social History   Socioeconomic History  . Marital status: Single    Spouse name: Not on file  . Number of children: Not on file  . Years of education: Not on file  . Highest education level: Not on file  Occupational History  . Not on file  Social Needs  . Financial resource strain: Not on file  . Food insecurity:    Worry: Not on file    Inability: Not on file  . Transportation needs:    Medical: Not on file    Non-medical: Not on file  Tobacco Use  . Smoking status: Current Some Day  Smoker    Packs/day: 0.25    Years: 0.50    Pack years: 0.12    Types: Cigarettes  . Smokeless tobacco: Never Used  . Tobacco comment: hookah occ   Substance and Sexual Activity  . Alcohol use: No  . Drug use: No  . Sexual activity: Not on file  Lifestyle  . Physical activity:    Days per week: Not on file    Minutes per session: Not on file  . Stress: Not on file  Relationships  . Social connections:    Talks on phone: Not on file    Gets together: Not on file    Attends religious service: Not on file    Active member of club or organization: Not on file    Attends meetings of clubs or organizations: Not on file    Relationship status: Not on file  . Intimate partner violence:    Fear of current or ex partner: Not on file    Emotionally abused: Not on file    Physically abused: Not on file    Forced sexual activity: Not on file  Other Topics Concern  . Not on file  Social History Narrative  . Not on file    Family History  Problem Relation Age of Onset  . Asthma Neg Hx     BP 106/74   Ht 6\' 1"  (1.854 m)   Wt 160 lb (72.6 kg)   BMI 21.11 kg/m  Review of Systems: See HPI above.     Objective:  Physical Exam:  Gen: NAD, comfortable in exam room  Back: No gross deformity, scoliosis. TTP left low lumbar, gluteal muscles.  No midline or bony TTP. Mild limitation flexion and extension. Strength LEs 5/5 all muscle groups.   2+ MSRs in achilles tendons and trace patellar tendons, equal bilaterally. Negative SLRs. Sensation intact to light touch bilaterally.  Left hip: No deformity. FROM with 5/5 strength. TTP left gluteal region including over piriformis NVI distally. Negative logroll Cannot tolerate fabers and piriformis stretches due to pain.   Assessment & Plan:  1. Low back pain with radiation into left leg - reviewed MRI results with him, no abnormalities to account for his severe pain.  Reassured him.  Consistent with irritation of sciatic nerve  likely from piriformis, gluteal musculature with spasms.  He will start physical therapy.  Try naproxen with tizanidine as needed instead.  Because of difficulty sleeping he will also take nortriptyline at bedtime.  F/u in 1 month.

## 2018-05-01 ENCOUNTER — Encounter: Payer: Self-pay | Admitting: Physical Therapy

## 2018-05-01 ENCOUNTER — Ambulatory Visit: Payer: 59 | Attending: Family Medicine | Admitting: Physical Therapy

## 2018-05-01 DIAGNOSIS — M5442 Lumbago with sciatica, left side: Secondary | ICD-10-CM | POA: Insufficient documentation

## 2018-05-01 DIAGNOSIS — R262 Difficulty in walking, not elsewhere classified: Secondary | ICD-10-CM

## 2018-05-01 NOTE — Therapy (Signed)
[facility] [address]  [address] [place], [place], [address] Phone: [phone]   Fax:  [phone]  Physical Therapy Evaluation  Patient Details  Name: Larry Ross MRN: [id number] Date of Birth: 05-15-1991 Referring Provider (PT): Norton Blizzard. MD   Encounter Date: 05/01/2018  PT End of Session - 05/01/18 1111    Visit Number  1    Number of Visits  12    Date for PT Re-Evaluation  06/12/18    Authorization Type  UHC    PT Start Time  1015    PT Stop Time  1102    PT Time Calculation (min)  47 min    Activity Tolerance  Patient limited by pain    Behavior During Therapy  [facility] for tasks assessed/performed       History reviewed. No pertinent past medical history.  History reviewed. No pertinent surgical history.  There were no vitals filed for this visit.   Subjective Assessment - 05/01/18 1026    Subjective  Pt relays Lt sided back and glute pain with insidious onset that started about 2 months ago. He has not been able to find any relief other than with pain meds and short term relief from injections. He had MRI which was negative. 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     How long can you sit comfortably?  one hour    How long can you stand comfortably?  hour    How long can you walk comfortably?  always in pain with walking    Diagnostic tests  MRI negative    Patient Stated Goals  Get rid of the pain    Currently in Pain?  Yes    Pain Score  9     Pain Location  Back   Lt glute   Pain Orientation  Left    Pain Descriptors / Indicators  Tingling;Throbbing;Sharp    Pain Type  Acute pain    Pain Radiating Towards  Lt glute    Pain Onset  More than a month ago    Pain Frequency  Constant    Aggravating Factors   walking, laying on Lt side    Multiple Pain Sites  No         [facility] PT Assessment - 05/01/18 0001      Assessment   Medical Diagnosis  LBP with Lt sciatica    Referring Provider (PT)  Shane Hudnall. MD    Onset Date/Surgical Date  --   2 month onset of pain   Next MD Visit  05/15/18    Prior Therapy  none      Precautions   Precautions  None      Balance Screen   Has the patient fallen in the past 6 months  No      Home Environment   Living Environment  Private residence      Prior Function   Level of Independence  Independent    Vocation  Full time employment   currently out of work per MD   Vocation Requirements  manual labor, lifing and standing work      Cognition   Overall Cognitive Status  Within Functional Limits for tasks assessed      Observation/Other Assessments   Focus on Therapeutic Outcomes (FOTO)   59% limited      ROM / Strength  AROM / PROM / Strength  AROM;Strength      AROM   AROM Assessment Site  Lumbar;Hip    Right/Left Hip  Left    Left Hip Flexion  90   limited by pain   Left Hip External Rotation   25   limited by pain   Left Hip Internal Rotation   25   limited by pain   Lumbar Flexion  75%    Lumbar Extension  25%    Lumbar - Right Side Bend  75%    Lumbar - Left Side Bend  75%    Lumbar - Right Rotation  75%    Lumbar - Left Rotation  75%      Strength   Overall Strength Comments  knee 5/5, Lt hip 3/5 due to pain, Rt hip 5/5      Palpation   Palpation comment  tightness and TTP in glutes, pirifomis      Special Tests   Other special tests  + slump test, neg SLR,       Ambulation/Gait   Gait Comments  slight antalgic gait with slower speed and decreased step length Lt                Objective measurements completed on examination: See above findings.      [facility] Adult PT Treatment/Exercise - 05/01/18 0001      Modalities   Modalities  Moist Heat;Electrical Stimulation      Moist Heat Therapy   Number Minutes Moist Heat  15 Minutes    Moist Heat Location  Hip      Electrical Stimulation   Electrical Stimulation Location   Lumbar, glutes, piriformis    Electrical Stimulation Action  IFC    Electrical Stimulation Parameters  80-150 mhz to tolerance 15 min    Electrical Stimulation Goals  Pain             PT Education - 05/01/18 1109    Education Details  HEP, POC, TENS    Person(s) Educated  Patient    Methods  Explanation;Demonstration;Verbal cues;Handout    Comprehension  Verbalized understanding;Need further instruction          PT Long Term Goals - 05/01/18 1122      PT LONG TERM GOAL #1   Title  Pt will be I and compliant with HEP. 6 weeks 06/12/18    Status  New      PT LONG TERM GOAL #2   Title  Pt will improve FOTO to less than 37% limited. 6 weeks 06/12/18    Status  New      PT LONG TERM GOAL #3   Title  Pt will improve lumbar AROM and Lt hip AROM to WNL. 6 weeks 06/12/18    Status  New      PT LONG TERM GOAL #4   Title  Pt will be able to return to work performing his usual tasks of lifting and walking, with less than 2/-3/10 overall pain. 6 weeks 06/12/18    Status  New             Plan - 05/01/18 1113    Clinical Impression Statement  Pt presents with Lt LBP with piriformis syndrome/sciatica. He relays tingling but denies numbness or pain going down past his glutes. He had neg MRI, neg SLR, but did have positive slump test and was TTP in glutes and piriformis. He has decreased hip/back ROM and strength, decreased activity tolerance, and  increased pain limiting his full funciton. He will benefit from skilled PT to address his deficits.     Clinical Presentation  Evolving    Clinical Presentation due to:  worsening pain    Clinical Decision Making  Moderate    Rehab Potential  Good    Clinical Impairments Affecting Rehab Potential  MRI negative despite pt reports of 9 to 10/10 pain.    PT Frequency  Other (comment)   1-2   PT Duration  6 weeks    PT Treatment/Interventions  Cryotherapy;Electrical Stimulation;Iontophoresis 4mg /ml Dexamethasone;Moist  Heat;Traction;Ultrasound;Therapeutic activities;Therapeutic exercise;Neuromuscular re-education;Manual techniques;Passive range of motion;Dry needling;Taping;Spinal Manipulations;Joint Manipulations    PT Next Visit Plan  MT to piriformis and glutes, LE/lumbar stretching and strenghtening, consider modalties for pain and DN for muscle spasm    Consulted and Agree with Plan of Care  Patient       Patient will benefit from skilled therapeutic intervention in order to improve the following deficits and impairments:  Decreased activity tolerance, Decreased endurance, Decreased range of motion, Decreased strength, Impaired flexibility, Pain, Increased muscle spasms, Difficulty walking  Visit Diagnosis: Acute left-sided low back pain with left-sided sciatica  Difficulty walking     Problem List Patient Active Problem List   Diagnosis Date Noted  . 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 Smoker 09/23/2013    April Manson, PT, DPT 05/01/2018, 11:46 AM  [facility] [address]  [address] [place], [place], [address] Phone: [phone]   Fax:  [phone]  Name: Larry Ross MRN: [id number] Date of Birth: 1991/07/17

## 2018-05-01 NOTE — Patient Instructions (Signed)

## 2018-05-06 ENCOUNTER — Ambulatory Visit: Payer: 59 | Admitting: Physical Therapy

## 2018-05-06 DIAGNOSIS — M5442 Lumbago with sciatica, left side: Secondary | ICD-10-CM | POA: Diagnosis not present

## 2018-05-06 DIAGNOSIS — R262 Difficulty in walking, not elsewhere classified: Secondary | ICD-10-CM

## 2018-05-06 NOTE — Therapy (Signed)
[facility] [address]  [address] [place], [place], [address] Phone: [phone]   Fax:  [phone]  Physical Therapy Treatment  Patient Details  Name: Larry Ross MRN: [id number] Date of Birth: 1990/08/01 Referring Provider (PT): Norton Blizzard. MD   Encounter Date: 05/06/2018  PT End of Session - 05/06/18 1040    Visit Number  2    Number of Visits  12    Date for PT Re-Evaluation  06/12/18    Authorization Type  UHC    PT Start Time  0900   pt late   PT Stop Time  0935    PT Time Calculation (min)  35 min    Activity Tolerance  Patient tolerated treatment well    Behavior During Therapy  [facility] for tasks assessed/performed       No past medical history on file.  No past surgical history on file.  There were no vitals filed for this visit.  Subjective Assessment - 05/06/18 1033    Subjective  Pt rleays stretches are helping some.     Currently in Pain?  Yes    Pain Score  7     Pain Location  Back    Pain Orientation  Left    Pain Descriptors / Indicators  Aching                       [facility] Adult PT Treatment/Exercise - 05/06/18 0001      Exercises   Exercises  Lumbar      Lumbar Exercises: Stretches   Active Hamstring Stretch  Left;30 seconds;Right;2 reps    Single Knee to Chest Stretch  Left;2 reps;30 seconds    Lower Trunk Rotation  --   5 sec hold 10 reps bilat   Piriformis Stretch  Right;Left;2 reps;30 seconds      Lumbar Exercises: Aerobic   Nustep  5 min warm up L1      Lumbar Exercises: Supine   Bent Knee Raise  10 reps    Bent Knee Raise Limitations  with TAC      Modalities   Modalities  Moist Heat;Electrical Stimulation      Moist Heat Therapy   Number Minutes Moist Heat  15 Minutes    Moist Heat Location  Hip      Electrical Stimulation   Electrical Stimulation Location  Lumbar, glutes, piriformis    Electrical Stimulation Action  IFC    Electrical  Stimulation Parameters  80-150 MHZ to tolerance    Electrical Stimulation Goals  Pain             PT Education - 05/06/18 1040    Education Details  technique and cuing for stretches and therex    Person(s) Educated  Patient    Methods  Explanation;Demonstration;Verbal cues    Comprehension  Verbalized understanding;Returned demonstration          PT Long Term Goals - 05/01/18 1122      PT LONG TERM GOAL #1   Title  Pt will be I and compliant with HEP. 6 weeks 06/12/18    Status  New      PT LONG TERM GOAL #2   Title  Pt will improve FOTO to less than 37% limited. 6 weeks 06/12/18    Status  New      PT LONG TERM GOAL #3   Title  Pt will improve lumbar AROM and Lt hip AROM  to WNL. 6 weeks 06/12/18    Status  New      PT LONG TERM GOAL #4   Title  Pt will be able to return to work performing his usual tasks of lifting and walking, with less than 2/-3/10 overall pain. 6 weeks 06/12/18    Status  New            Plan - 05/06/18 1042    Clinical Impression Statement  Pt late today so shorter session with therex. Session focused on LE and lumbar stretching with begining core stablization to tolerance. He did appear to have better ROM today but he needed cuing to stay in pain free ROM. Session ended with MHP and TENS as he relays this helped with pain last time. PT will progress as tolerated.     Rehab Potential  Good    Clinical Impairments Affecting Rehab Potential  MRI neg    PT Frequency  Other (comment)   1-2   PT Duration  6 weeks    PT Treatment/Interventions  Cryotherapy;Electrical Stimulation;Iontophoresis 4mg /ml Dexamethasone;Moist Heat;Traction;Ultrasound;Therapeutic activities;Therapeutic exercise;Neuromuscular re-education;Manual techniques;Passive range of motion;Dry needling;Taping;Spinal Manipulations;Joint Manipulations    PT Next Visit Plan  MT to piriformis and glutes, LE/lumbar stretching and strenghtening, consider modalties for pain and DN for  muscle spasm    Consulted and Agree with Plan of Care  Patient       Patient will benefit from skilled therapeutic intervention in order to improve the following deficits and impairments:  Decreased activity tolerance, Decreased endurance, Decreased range of motion, Decreased strength, Impaired flexibility, Pain, Increased muscle spasms, Difficulty walking  Visit Diagnosis: Acute left-sided low back pain with left-sided sciatica  Difficulty walking     Problem List Patient Active Problem List   Diagnosis Date Noted  . 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 Smoker 09/23/2013    April Manson, PT, DPT 05/06/2018, 10:46 AM  [facility] [address]  [address] [place], [place], [address] Phone: [phone]   Fax:  [phone]  Name: Larry Ross MRN: [id number] Date of Birth: 1990-10-19

## 2018-05-08 ENCOUNTER — Encounter: Payer: Self-pay | Admitting: Family Medicine

## 2018-05-08 ENCOUNTER — Ambulatory Visit (INDEPENDENT_AMBULATORY_CARE_PROVIDER_SITE_OTHER): Payer: 59 | Admitting: Family Medicine

## 2018-05-08 VITALS — BP 108/70 | Ht 73.0 in | Wt 150.0 lb

## 2018-05-08 DIAGNOSIS — M79605 Pain in left leg: Secondary | ICD-10-CM

## 2018-05-08 DIAGNOSIS — M545 Low back pain, unspecified: Secondary | ICD-10-CM

## 2018-05-08 NOTE — Patient Instructions (Signed)
Continue physical therapy and when tolerated transition to home exercise program. Do home exercises on days you don't go to therapy. Continue the naproxen and nortriptyline. Hopefully you can wean off the nortriptyline first as your pain resolves then the naproxen over the next several weeks. Follow up with me in 6-8 weeks (or as needed if you're doing well and get to the point where you don't need the medicines either).

## 2018-05-08 NOTE — Progress Notes (Signed)
PCP: Patient, No Pcp Per  Subjective:   HPI: Patient is a 27 y.o. male here for low back pain.  8/26: Patient reports for about 2 months he's had worsening left sided low back pain. Pain level 10/10 and sharp, radiating down left leg with associated cramping. Feels like posterior left hip will lock up on him. No numbness. No bowel/bladder dysfunction. No benefit so far with flexeril, advil, tramadol, prednisone dose packs. No skin changes. Had problems with right side a year ago that resolved.  9/25: Patient reports he continues to have 10/10 level pain left side posterior hip/buttock radiating down leg to about the knee. Diclofenac and percocet both help transiently. No bowel/bladder dysfunction. Robaxin has not helped. No skin changes. No numbness.  10/16: Patient reports he's doing well. Pain level down to 5/10 in low back and down into left leg. Done 2 visits of physical therapy and is doing home exercises. Feels better in the morning than he has;  Does have soreness with walking. No skin changes. No numbness.  History reviewed. No pertinent past medical history.  Current Outpatient Medications on File Prior to Visit  Medication Sig Dispense Refill  . naproxen (NAPROSYN) 500 MG tablet Take 1 tablet (500 mg total) by mouth 2 (two) times daily as needed. 60 tablet 2  . nortriptyline (PAMELOR) 25 MG capsule Take 1 capsule (25 mg total) by mouth at bedtime. 30 capsule 2  . albuterol (VENTOLIN HFA) 108 (90 BASE) MCG/ACT inhaler Inhale 2 puffs into the lungs every 4 (four) hours as needed for wheezing or shortness of breath.    . budesonide-formoterol (SYMBICORT) 80-4.5 MCG/ACT inhaler Take 2 puffs first thing in am and then another 2 puffs about 12 hours later. (Patient not taking: Reported on 04/17/2018) 1 Inhaler 11  . tiZANidine (ZANAFLEX) 4 MG tablet Take 1 tablet (4 mg total) by mouth every 8 (eight) hours as needed. (Patient not taking: Reported on 05/08/2018) 60 tablet 1    No current facility-administered medications on file prior to visit.     History reviewed. No pertinent surgical history.  No Known Allergies  Social History   Socioeconomic History  . Marital status: Single    Spouse name: Not on file  . Number of children: Not on file  . Years of education: Not on file  . Highest education level: Not on file  Occupational History  . Not on file  Social Needs  . Financial resource strain: Not on file  . Food insecurity:    Worry: Not on file    Inability: Not on file  . Transportation needs:    Medical: Not on file    Non-medical: Not on file  Tobacco Use  . Smoking status: Current Some Day Smoker    Packs/day: 0.25    Years: 0.50    Pack years: 0.12    Types: Cigarettes  . Smokeless tobacco: Never Used  . Tobacco comment: hookah occ   Substance and Sexual Activity  . Alcohol use: No  . Drug use: No  . Sexual activity: Not on file  Lifestyle  . Physical activity:    Days per week: Not on file    Minutes per session: Not on file  . Stress: Not on file  Relationships  . Social connections:    Talks on phone: Not on file    Gets together: Not on file    Attends religious service: Not on file    Active member of club or organization: Not  on file    Attends meetings of clubs or organizations: Not on file    Relationship status: Not on file  . Intimate partner violence:    Fear of current or ex partner: Not on file    Emotionally abused: Not on file    Physically abused: Not on file    Forced sexual activity: Not on file  Other Topics Concern  . Not on file  Social History Narrative  . Not on file    Family History  Problem Relation Age of Onset  . Asthma Neg Hx     BP 108/70   Ht 6\' 1"  (1.854 m)   Wt 150 lb (68 kg)   BMI 19.79 kg/m   Review of Systems: See HPI above.     Objective:  Physical Exam:  Gen: NAD, comfortable in exam room  Back: No gross deformity, scoliosis. No TTP .  No midline or bony  TTP. ROM - only limitation is flexion to 80 degrees only.  Mild pain with this.  No other limitations. Strength LEs 5/5 all muscle groups.   2+ MSRs in patellar and achilles tendons, equal bilaterally. Negative SLRs. Sensation intact to light touch bilaterally.   Assessment & Plan:  1. Low back pain with radiation into left leg - MRI negative for pathology to account for pain.  2/2 irritation of sciatic nerve from piriformis, gluteal musculature with spasms.  Continue with PT, home exercises.  Naproxen and nortriptyline.  F/u in 6-8 weeks (as needed if he's doing well and weans off medications).

## 2018-05-09 ENCOUNTER — Ambulatory Visit: Payer: 59 | Admitting: Physical Therapy

## 2018-05-09 ENCOUNTER — Encounter: Payer: Self-pay | Admitting: Physical Therapy

## 2018-05-09 DIAGNOSIS — R262 Difficulty in walking, not elsewhere classified: Secondary | ICD-10-CM

## 2018-05-09 DIAGNOSIS — M5442 Lumbago with sciatica, left side: Secondary | ICD-10-CM

## 2018-05-09 NOTE — Therapy (Signed)
[facility] [address]  [address] [place], [place], [address] Phone: [phone]   Fax:  [phone]  Physical Therapy Treatment  Patient Details  Name: Larry Ross MRN: [id number] Date of Birth: 10/26/1990 Referring Provider (PT): Norton Blizzard. MD   Encounter Date: 05/09/2018  PT End of Session - 05/09/18 0900    Visit Number  3    Number of Visits  12    Date for PT Re-Evaluation  06/12/18    Authorization Type  UHC    PT Start Time  0900   pt arrived late   PT Stop Time  0947    PT Time Calculation (min)  47 min    Activity Tolerance  Patient tolerated treatment well    Behavior During Therapy  [facility] for tasks assessed/performed       History reviewed. No pertinent past medical history.  History reviewed. No pertinent surgical history.  There were no vitals filed for this visit.  Subjective Assessment - 05/09/18 0902    Subjective  Pt reporting pain is "a lot better".    Diagnostic tests  MRI negative    Patient Stated Goals  Get rid of the pain    Currently in Pain?  Yes    Pain Score  3    only with walking or stepping down off a curb   Pain Location  Back   & buttock   Pain Orientation  Lower;Left    Pain Descriptors / Indicators  Aching    Pain Type  Acute pain    Pain Frequency  Intermittent                       [facility] Adult PT Treatment/Exercise - 05/09/18 0900      Exercises   Exercises  Lumbar      Lumbar Exercises: Aerobic   Nustep  L3 x 5 min      Lumbar Exercises: Supine   Clam  10 reps;3 seconds    Clam Limitations  alt hip ABD/ER with red TB    Bridge  10 reps;3 seconds;Non-compliant    Bridge Limitations  + hip ABD isometric with red TB      Modalities   Modalities  Electrical Stimulation;Moist Heat      Moist Heat Therapy   Moist Heat Location  Hip      Electrical Stimulation   Electrical Stimulation Location  Lumbar spine & L glutes/piriformis    Electrical Stimulation Action  IFC    Electrical Stimulation Parameters  80-150 mHz, intensity to pt tol x 15'    Electrical Stimulation Goals  Pain      Manual Therapy   Manual Therapy  Soft tissue mobilization;Myofascial release;Other (comment)    Manual therapy comments  R sidelying with L LE on bolster    Soft tissue mobilization  L glutes & prirfomis    Myofascial Release  manual TPR to L glute minimus & piriformis    Other Manual Therapy  Provided instruction in self-STM to glutes/piriformis with small ball on wall or chair             PT Education - 05/09/18 0930    Education Details  HEP update - red TB provided for home    Person(s) Educated  Patient    Methods  Explanation;Demonstration;Handout    Comprehension  Verbalized understanding;Returned demonstration;Need further instruction          PT Long  Term Goals - 05/09/18 0904      PT LONG TERM GOAL #1   Title  Pt will be I and compliant with HEP. 6 weeks 06/12/18    Status  On-going      PT LONG TERM GOAL #2   Title  Pt will improve FOTO to less than 37% limited. 6 weeks 06/12/18    Status  On-going      PT LONG TERM GOAL #3   Title  Pt will improve lumbar AROM and Lt hip AROM to WNL. 6 weeks 06/12/18    Status  On-going      PT LONG TERM GOAL #4   Title  Pt will be able to return to work performing his usual tasks of lifting and walking, with less than 2/-3/10 overall pain. 6 weeks 06/12/18    Status  On-going            Plan - 05/09/18 0904    Clinical Impression Statement  [name] reporting pain much better since start of PT, with pain now only while walking or stepping down such as when stepping off a curb. Increased muscle tension/spasms present in L glutes and piriformis which responded well to manual therapy - provided instruction in self-STM with ball on wall for home. Progressed strengthening exercises targeting glutes & piriformis with good tolerance, therefore updated HEP.    Rehab Potential   Good    Clinical Impairments Affecting Rehab Potential  MRI neg    PT Treatment/Interventions  Cryotherapy;Electrical Stimulation;Iontophoresis 4mg /ml Dexamethasone;Moist Heat;Traction;Ultrasound;Therapeutic activities;Therapeutic exercise;Neuromuscular re-education;Manual techniques;Passive range of motion;Dry needling;Taping;Spinal Manipulations;Joint Manipulations    PT Next Visit Plan  MT to piriformis and glutes, LE/lumbar stretching and strenghtening, consider modalities for pain and DN for muscle spasm    Consulted and Agree with Plan of Care  Patient       Patient will benefit from skilled therapeutic intervention in order to improve the following deficits and impairments:  Decreased activity tolerance, Decreased endurance, Decreased range of motion, Decreased strength, Impaired flexibility, Pain, Increased muscle spasms, Difficulty walking  Visit Diagnosis: Acute left-sided low back pain with left-sided sciatica  Difficulty walking     Problem List Patient Active Problem List   Diagnosis Date Noted  . 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 Smoker 09/23/2013    Larry Ross, PT, MPT 05/09/2018, 9:58 AM  [facility] [address]  [address] [place], [place], [address] Phone: [phone]   Fax:  [phone]  Name: Larry Ross MRN: [id number] Date of Birth: [birth year]

## 2018-05-15 ENCOUNTER — Ambulatory Visit: Payer: 59 | Admitting: Physical Therapy

## 2018-05-15 DIAGNOSIS — M5442 Lumbago with sciatica, left side: Secondary | ICD-10-CM | POA: Diagnosis not present

## 2018-05-15 DIAGNOSIS — R262 Difficulty in walking, not elsewhere classified: Secondary | ICD-10-CM

## 2018-05-15 NOTE — Therapy (Signed)
[facility] [address]  [place], [place], [address] Phone: [phone]   Fax:  [phone]  Physical Therapy Treatment  Patient Details  Name: Larry Ross MRN: [id number] Date of Birth: [birth year] Referring Provider (PT): Karlton Lemon. MD   Encounter Date: 05/15/2018  PT End of Session - 05/15/18 0921    Visit Number  4    Number of Visits  12    Date for PT Re-Evaluation  06/12/18    Authorization Type  UHC    PT Start Time  0850    PT Stop Time  0932    PT Time Calculation (min)  42 min    Activity Tolerance  Patient tolerated treatment well    Behavior During Therapy  [facility] for tasks assessed/performed       No past medical history on file.  No past surgical history on file.  There were no vitals filed for this visit.  Subjective Assessment - 05/15/18 0906    Subjective  Pt reports he is improving with PT    Currently in Pain?  Yes    Pain Score  3     Pain Location  Back    Pain Orientation  Left;Lower    Pain Descriptors / Indicators  Aching    Pain Type  Acute pain         [facility] PT Assessment - 05/15/18 0001      AROM   Left Hip External Rotation   35    Left Hip Internal Rotation   30    Lumbar Flexion  90%    Lumbar Extension  50%    Lumbar - Right Side Bend  WNL    Lumbar - Left Side Bend  WNL    Lumbar - Right Rotation  WNL    Lumbar - Left Rotation  WNL      Strength   Overall Strength Comments  Lt hip improved to 4/5 MMT grossly                   [facility] Adult PT Treatment/Exercise - 05/15/18 0001      Lumbar Exercises: Stretches   Active Hamstring Stretch  Left;30 seconds;Right;2 reps    Piriformis Stretch  Left;3 reps;30 seconds      Lumbar Exercises: Aerobic   Nustep  L5 X 6 min      Lumbar Exercises: Supine   Clam  15 reps;3 seconds    Clam Limitations  alt hip ABD/ER with red TB    Bridge  15 reps;3 seconds    Bridge Limitations  + hip ABD  isometric with red TB      Modalities   Modalities  Electrical Stimulation;Moist Heat      Moist Heat Therapy   Number Minutes Moist Heat  15 Minutes    Moist Heat Location  Hip      Electrical Stimulation   Electrical Stimulation Location  Lumbar spine & L glutes/piriformis    Electrical Stimulation Action  IFC    Electrical Stimulation Parameters  80-150 mhz to tolerance in sidelying    Electrical Stimulation Goals  Pain      Manual Therapy   Manual Therapy  Soft tissue mobilization;Myofascial release;Other (comment)    Manual therapy comments  R sidelying with L LE on bolster    Soft tissue mobilization  L glutes & prirfomis, STM and IASTM    Myofascial Release  manual TPR to L glute  minimus & piriformis                  PT Long Term Goals - 05/15/18 0924      PT LONG TERM GOAL #1   Title  Pt will be I and compliant with HEP. 6 weeks 06/12/18    Status  On-going      PT LONG TERM GOAL #2   Title  Pt will improve FOTO to less than 37% limited. 6 weeks 06/12/18    Status  On-going      PT LONG TERM GOAL #3   Title  Pt will improve lumbar AROM and Lt hip AROM to WNL. 6 weeks 06/12/18    Status  Partially Met      PT LONG TERM GOAL #4   Title  Pt will be able to return to work performing his usual tasks of lifting and walking, with less than 2/-3/10 overall pain. 6 weeks 06/12/18    Status  Partially Met            Plan - 05/15/18 2248    Clinical Impression Statement  Pt had improved lumbar ROM and Lt hip strength today when measurements were assessed, see section above. He is making good progress in all areas and will continue to benefit from PT and manual therapy.     Rehab Potential  Good    PT Frequency  Other (comment)   1-2   PT Duration  6 weeks    PT Treatment/Interventions  Cryotherapy;Electrical Stimulation;Iontophoresis 71m/ml Dexamethasone;Moist Heat;Traction;Ultrasound;Therapeutic activities;Therapeutic exercise;Neuromuscular  re-education;Manual techniques;Passive range of motion;Dry needling;Taping;Spinal Manipulations;Joint Manipulations    PT Next Visit Plan  MT to piriformis and glutes, LE/lumbar stretching and strenghtening, consider modalities for pain and DN for muscle spasm    Consulted and Agree with Plan of Care  Patient       Patient will benefit from skilled therapeutic intervention in order to improve the following deficits and impairments:  Decreased activity tolerance, Decreased endurance, Decreased range of motion, Decreased strength, Impaired flexibility, Pain, Increased muscle spasms, Difficulty walking  Visit Diagnosis: Acute left-sided low back pain with left-sided sciatica  Difficulty walking     Problem List Patient Active Problem List   Diagnosis Date Noted  . 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 Smoker 09/23/2013    BDebbe Odea PT, DPT 05/15/2018, 9:25 AM  [facility] [place] [address]Phone: [phone]  Fax:  [phone] Name: Larry CazarezMRN: [id number] of Birth: 409/29/1992

## 2018-05-22 ENCOUNTER — Ambulatory Visit: Payer: 59 | Admitting: Physical Therapy

## 2018-05-29 ENCOUNTER — Ambulatory Visit: Payer: 59 | Attending: Family Medicine | Admitting: Physical Therapy

## 2018-05-29 ENCOUNTER — Encounter: Payer: Self-pay | Admitting: Physical Therapy

## 2018-05-29 DIAGNOSIS — M5442 Lumbago with sciatica, left side: Secondary | ICD-10-CM | POA: Insufficient documentation

## 2018-05-29 DIAGNOSIS — R262 Difficulty in walking, not elsewhere classified: Secondary | ICD-10-CM | POA: Diagnosis present

## 2018-05-29 NOTE — Therapy (Signed)
[facility] [address]  [place], [place], [address] Phone: [phone]   Fax:  [phone]  Physical Therapy Treatment  Patient Details  Name: Larry Ross MRN: [id number] Date of Birth: [birth year] Referring Provider (PT): Karlton Lemon. MD   Encounter Date: 05/29/2018  PT End of Session - 05/29/18 0941    Visit Number  5    Number of Visits  12    Date for PT Re-Evaluation  06/12/18    Authorization Type  UHC    PT Start Time  [phone]   pt arrived late   PT Stop Time  1034    PT Time Calculation (min)  53 min    Activity Tolerance  Patient tolerated treatment well    Behavior During Therapy  [facility] for tasks assessed/performed       History reviewed. No pertinent past medical history.  History reviewed. No pertinent surgical history.  There were no vitals filed for this visit.  Subjective Assessment - 05/29/18 0945    Subjective  Pt still noting L LBP with hard step on L LE or with pivoting motions when he tried to play basketball, but otherwise pain less common. Was able to run/jog a little around the basketball court but still limited by pain.    Patient Stated Goals  Get rid of the pain    Currently in Pain?  Yes    Pain Score  2     Pain Location  Buttocks    Pain Orientation  Left    Pain Descriptors / Indicators  Aching    Pain Type  Acute pain    Pain Frequency  Intermittent         [facility] PT Assessment - 05/29/18 0941      Assessment   Medical Diagnosis  LBP with Lt sciatica    Referring Provider (PT)  Karlton Lemon. MD    Next MD Visit  as needed                   [facility] Adult PT Treatment/Exercise - 05/29/18 0941      Exercises   Exercises  Lumbar      Lumbar Exercises: Stretches   Single Knee to Chest Stretch  Left;30 seconds;2 reps    Piriformis Stretch  Left;30 seconds;2 reps    Piriformis Stretch Limitations  KTOS    Figure 4 Stretch  30 seconds;2  reps;Supine;With overpressure    Figure 4 Stretch Limitations  knees to chest      Lumbar Exercises: Aerobic   Nustep  L5 x 6 min (LE only)      Lumbar Exercises: Supine   Bridge with clamshell  10 reps;5 seconds   alt hip ABD/ER with red TB     Lumbar Exercises: Sidelying   Clam  Left;10 reps;3 seconds    Clam Limitations  red TB      Modalities   Modalities  Electrical Stimulation;Moist Heat      Moist Heat Therapy   Number Minutes Moist Heat  15 Minutes    Moist Heat Location  Hip      Electrical Stimulation   Electrical Stimulation Location  Lumbar spine & L glutes/piriformis    Electrical Stimulation Action  IFC    Electrical Stimulation Parameters  80-150 mHz, intensity to pt tol x 15'    Electrical Stimulation Goals  Pain      Manual Therapy   Manual Therapy  Soft tissue  mobilization;Myofascial release    Manual therapy comments  R sidelying with L LE on bolster    Soft tissue mobilization  L glutes & prirfomis    Myofascial Release  manual TPR to L glute medius/minimus & piriformis       Trigger Point Dry Needling - 05/29/18 0941    Consent Given?  Yes    Education Handout Provided  Yes    Muscles Treated Lower Body  Gluteus maximus;Gluteus minimus    Gluteus Maximus Response  Twitch response elicited;Palpable increased muscle length   & Lt gluteus medius   Gluteus Minimus Response  Twitch response elicited;Palpable increased muscle length   Lt          PT Education - 05/29/18 0950    Education Details  Role of DN in addressing pain/abnormal muscle tension, expected response to treatement and post treatment activity/HEP recommendations    Person(s) Educated  Patient    Methods  Explanation;Handout;Demonstration    Comprehension  Verbalized understanding          PT Long Term Goals - 05/15/18 2725      PT LONG TERM GOAL #1   Title  Pt will be I and compliant with HEP. 6 weeks 06/12/18    Status  On-going      PT LONG TERM GOAL #2   Title  Pt  will improve FOTO to less than 37% limited. 6 weeks 06/12/18    Status  On-going      PT LONG TERM GOAL #3   Title  Pt will improve lumbar AROM and Lt hip AROM to WNL. 6 weeks 06/12/18    Status  Partially Met      PT LONG TERM GOAL #4   Title  Pt will be able to return to work performing his usual tasks of lifting and walking, with less than 2/-3/10 overall pain. 6 weeks 06/12/18    Status  Partially Met            Plan - 05/29/18 0948    Clinical Impression Statement  Larry Ross reporting improvement in LBP but still noting L buttock pain upon weight acceptance during gait with hard step on elft as well as with attempts at jogging/running. Pt localizing pain to L glute medius and minimus area with increased muscle tension and taut bands/TPs identified. Performed DN in conjunction with manual therapy to L glutes after informed pt consent received - strong twitch response elicited with palpable reduction in muscle tension following treatment. Reviewed relevant stretches and strengthening exercises to promote further normalization of muscle tension and concluded treatment with estim and moist heat to promote further muscle relaxation.    Rehab Potential  Good    PT Frequency  Other (comment)   1-2   PT Duration  6 weeks    PT Treatment/Interventions  Cryotherapy;Electrical Stimulation;Iontophoresis 78m/ml Dexamethasone;Moist Heat;Traction;Ultrasound;Therapeutic activities;Therapeutic exercise;Neuromuscular re-education;Manual techniques;Passive range of motion;Dry needling;Taping;Spinal Manipulations;Joint Manipulations    PT Next Visit Plan  MT to piriformis and glutes, LE/lumbar stretching and strenghtening, consider modalities for pain and DN for muscle spasm    Consulted and Agree with Plan of Care  Patient       Patient will benefit from skilled therapeutic intervention in order to improve the following deficits and impairments:  Decreased activity tolerance, Decreased endurance,  Decreased range of motion, Decreased strength, Impaired flexibility, Pain, Increased muscle spasms, Difficulty walking  Visit Diagnosis: Acute left-sided low back pain with left-sided sciatica  Difficulty walking     Problem  List Patient Active Problem List   Diagnosis Date Noted  . 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 Smoker 09/23/2013    Percival Spanish, PT, MPT 05/29/2018, 1:42 PM  [facility] [address]  [place], [place], [address] Phone: [phone]   Fax:  [phone]  Name: Larry Ross MRN: [id number] Date of Birth: 11-08-1990

## 2018-05-29 NOTE — Patient Instructions (Signed)

## 2018-06-03 ENCOUNTER — Ambulatory Visit: Payer: 59

## 2018-06-03 DIAGNOSIS — M5442 Lumbago with sciatica, left side: Secondary | ICD-10-CM

## 2018-06-03 DIAGNOSIS — R262 Difficulty in walking, not elsewhere classified: Secondary | ICD-10-CM

## 2018-06-03 NOTE — Therapy (Signed)
[facility] [address]  [place], [place], [address] Phone: [phone]   Fax:  [phone]  Physical Therapy Treatment  Patient Details  Name: Larry Ross MRN: [id number] Date of Birth: 1991-02-06 Referring Provider (PT): Larry Ross. MD   Encounter Date: 06/03/2018  PT End of Session - 06/03/18 1623    Visit Number  6    Number of Visits  12    Date for PT Re-Evaluation  06/12/18    Authorization Type  UHC    PT Start Time  1620    PT Stop Time  0017    PT Time Calculation (min)  55 min    Activity Tolerance  Patient tolerated treatment well    Behavior During Therapy  WFL for tasks assessed/performed       No past medical history on file.  No past surgical history on file.  There were no vitals filed for this visit.  Subjective Assessment - 06/03/18 1622    Subjective  Pt. noting, "my hip locked up over the weekend".  Unsure if DN helped.      Diagnostic tests  MRI negative    Patient Stated Goals  Get rid of the pain    Currently in Pain?  Yes    Pain Score  5     Pain Location  Buttocks    Pain Orientation  Left;Right    Pain Descriptors / Indicators  Aching    Pain Type  Acute pain    Pain Radiating Towards  into B glutes     Multiple Pain Sites  No                       [facility] Adult PT Treatment/Exercise - 06/03/18 1631      Lumbar Exercises: Stretches   Passive Hamstring Stretch  Right;Left;1 rep;30 seconds    Passive Hamstring Stretch Limitations  strap     Single Knee to Chest Stretch  30 seconds;2 reps;Left;Right    Piriformis Stretch  Left;Right;2 reps;30 seconds    Piriformis Stretch Limitations  KTOS      Lumbar Exercises: Aerobic   Nustep  L5 x 7 min (LE only)      Lumbar Exercises: Machines for Strengthening   Cybex Knee Flexion  B LE's 20# x 10 reps       Lumbar Exercises: Standing   Functional Squats  3 seconds;15 reps   cues provided to  contract glutes    Functional Squats Limitations  counter     Other Standing Lumbar Exercises  B side-stepping with red TB at ankles 4 x 15 ft at counter       Lumbar Exercises: Supine   Clam  15 reps;5 seconds    Clam Limitations  alt hip ABD/ER with red TB    Bridge  15 reps;5 seconds    Bridge Limitations  + hip ABD isometric with red TB      Lumbar Exercises: Sidelying   Clam  Left;3 seconds   x 12 reps   Clam Limitations  red TB      Moist Heat Therapy   Number Minutes Moist Heat  15 Minutes    Moist Heat Location  Hip      Electrical Stimulation   Electrical Stimulation Location  Lumbar spine & R glutes/piriformis   noted more pain in R glutes thus E-stim applied to this area   Printmaker Action  IFC  Electrical Stimulation Parameters  80-_0 , intensity to pt. tolerance, 15'    Electrical Stimulation Goals  Pain      Manual Therapy   Manual Therapy  Soft tissue mobilization;Myofascial release    Manual therapy comments  R + L sidelying with LE on bolster    Soft tissue mobilization  B glutes & prirfomis    Myofascial Release  manual TPR to L glute medius/minimus & piriformis; R glute TPR in area of tenderness                  PT Long Term Goals - 05/15/18 0924      PT LONG TERM GOAL #1   Title  Pt will be I and compliant with HEP. 6 weeks 06/12/18    Status  On-going      PT LONG TERM GOAL #2   Title  Pt will improve FOTO to less than 37% limited. 6 weeks 06/12/18    Status  On-going      PT LONG TERM GOAL #3   Title  Pt will improve lumbar AROM and Lt hip AROM to WNL. 6 weeks 06/12/18    Status  Partially Met      PT LONG TERM GOAL #4   Title  Pt will be able to return to work performing his usual tasks of lifting and walking, with less than 2/-3/10 overall pain. 6 weeks 06/12/18    Status  Partially Met            Plan - 06/03/18 1637    Clinical Impression Statement  Pt. reporting he is unsure if DN helped and felt like  the R hip "locked up" on him over weekend.  Tolerated mild progression of glute strengthening activities well today followed by B glute/piri STM/TPR as pt. noting pain has migrated into B hips.  Pt. noting relief following manual therapy and ended visit with E-stim/moist heat to glutes to reduce pain and tone.  Will consider reviewing proper technique with gym related strengthening activities in future visits as pt. wishing to return to local gym.      PT Treatment/Interventions  Cryotherapy;Electrical Stimulation;Iontophoresis 57m/ml Dexamethasone;Moist Heat;Traction;Ultrasound;Therapeutic activities;Therapeutic exercise;Neuromuscular re-education;Manual techniques;Passive range of motion;Dry needling;Taping;Spinal Manipulations;Joint Manipulations    Consulted and Agree with Plan of Care  Patient       Patient will benefit from skilled therapeutic intervention in order to improve the following deficits and impairments:  Decreased activity tolerance, Decreased endurance, Decreased range of motion, Decreased strength, Impaired flexibility, Pain, Increased muscle spasms, Difficulty walking  Visit Diagnosis: Acute left-sided low back pain with left-sided sciatica  Difficulty walking     Problem List Patient Active Problem List   Diagnosis Date Noted  . 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 Smoker 09/23/2013    MBess Harvest Ross 06/03/18 5:27 PM   [facility] [address] [place] [address]Phone: [phone]  Fax:  [phone] Name: MJamaurie BernierMRN: [id number] of Birth: 4Feb 24, 1992

## 2018-06-12 ENCOUNTER — Ambulatory Visit: Payer: 59

## 2018-06-12 DIAGNOSIS — R262 Difficulty in walking, not elsewhere classified: Secondary | ICD-10-CM

## 2018-06-12 DIAGNOSIS — M5442 Lumbago with sciatica, left side: Secondary | ICD-10-CM | POA: Diagnosis not present

## 2018-06-12 NOTE — Therapy (Addendum)
[facility] [address]  [place], [place], [address] Phone: [phone]   Fax:  [phone]  Physical Therapy Treatment  Patient Details  Name: Larry Ross MRN: [id number] Date of Birth: [birth year] Referring Provider (PT): Karlton Lemon. MD   Encounter Date: 06/12/2018  PT End of Session - 06/12/18 1602    Visit Number  7    Number of Visits  12    Date for PT Re-Evaluation  06/12/18    Authorization Type  UHC    PT Start Time  1605    PT Stop Time  6979    PT Time Calculation (min)  40 min    Activity Tolerance  Patient tolerated treatment well    Behavior During Therapy  WFL for tasks assessed/performed       No past medical history on file.  No past surgical history on file.  There were no vitals filed for this visit.  Subjective Assessment - 06/12/18 1606    Subjective  Reports pain has improved and he is getting relief from stretches.  Is still having increased LBP with lifting garbage cans and yard waste bags at work.      Diagnostic tests  MRI negative    Patient Stated Goals  Get rid of the pain    Currently in Pain?  No/denies    Pain Score  0-No pain   up to 4/10 pain in lower back after prolonged supine laying which is short lasting in duration    Pain Location  Back    Pain Orientation  Left;Right    Pain Descriptors / Indicators  Aching    Pain Type  Acute pain    Pain Radiating Towards  no longer into glutes     Pain Onset  More than a month ago    Pain Frequency  Intermittent    Aggravating Factors   prolonged walking, getting up after laying on back for a longer time    Pain Relieving Factors  stretching     Multiple Pain Sites  No         [facility] PT Assessment - 06/12/18 1618      Observation/Other Assessments   Focus on Therapeutic Outcomes (FOTO)   67% (33% limitation)      AROM   Left Hip External Rotation   45    Left Hip Internal Rotation   36    Lumbar  Flexion  mid shins     Lumbar Extension  25% limited     Lumbar - Right Side Bend  WNL    Lumbar - Left Side Bend  WNL    Lumbar - Right Rotation  WNL    Lumbar - Left Rotation  WNL                   [facility] Adult PT Treatment/Exercise - 06/12/18 1604      Therapeutic Activites    Therapeutic Activities  Lifting    Lifting  Worked on lifting pillow case with 25# in it to simulate bag of yardwaste putting bag onto counter to simulate back of truck; cueing provided for neutral spine and facing body toward truck (counter); simulated emptying 25# yardwaste can into back of truck (waist height) using chair with 25# in seat lifting chair walking it over to mat table and dumping 25# out onto mat table; cueing required to keep weight close to BOS and to keep bend in hips/knees as  to reduce lumbar strain      Lumbar Exercises: Stretches   Passive Hamstring Stretch  Right;Left;1 rep;30 seconds    Passive Hamstring Stretch Limitations  strap     Single Knee to Chest Stretch  30 seconds;2 reps;Left;Right    Piriformis Stretch  Left;Right;2 reps;30 seconds    Piriformis Stretch Limitations  KTOS    Figure 4 Stretch  30 seconds;2 reps;Supine;With overpressure    Figure 4 Stretch Limitations  knees to chest      Lumbar Exercises: Aerobic   Nustep  L5 x 7 min (LE only)      Lumbar Exercises: Standing   Functional Squats  15 reps;3 seconds    Functional Squats Limitations  TRX - 3" hold    cueing required for proper pacing      Lumbar Exercises: Supine   Clam  15 reps;5 seconds    Clam Limitations  alt hip ABD/ER with gree TB      Lumbar Exercises: Sidelying   Other Sidelying Lumbar Exercises  B sidelying "open book" stretch 5" x 5 reps each way       Lumbar Exercises: Prone   Other Prone Lumbar Exercises  Prone press ups 3" x 10 reps   no pain increase - only partial ROM due to back "tightness"   Other Prone Lumbar Exercises  POE x 20 sec    no pain increase                   PT Long Term Goals - 06/12/18 1613      PT LONG TERM GOAL #1   Title  Pt will be I and compliant with HEP. 6 weeks 06/12/18    Status  Partially Met      PT LONG TERM GOAL #2   Title  Pt will improve FOTO to less than 37% limited. 6 weeks 06/12/18    Status  Achieved      PT LONG TERM GOAL #3   Title  Pt will improve lumbar AROM and Lt hip AROM to WNL. 6 weeks 06/12/18    Status  Partially Met      PT LONG TERM GOAL #4   Title  Pt will be able to return to work performing his usual tasks of lifting and walking, with less than 2/-3/10 overall pain. 6 weeks 06/12/18    Status  Partially Met            Plan - 06/12/18 1615    Clinical Impression Statement  Larry Ross reporting LBP has improved since last visit and no longer having buttocks pain.  Still having some LBP with lifting garbage cans and yard waste bags at work thus simulated this in today's session with pt. requiring some cueing for neutral spine and overall lifting technique.  Therex focused on squatting type activities for carryover with lifting at work.  Will continue to progress toward goals.      Clinical Impairments Affecting Rehab Potential  MRI neg    PT Treatment/Interventions  Cryotherapy;Electrical Stimulation;Iontophoresis 65m/ml Dexamethasone;Moist Heat;Traction;Ultrasound;Therapeutic activities;Therapeutic exercise;Neuromuscular re-education;Manual techniques;Passive range of motion;Dry needling;Taping;Spinal Manipulations;Joint Manipulations    PT Next Visit Plan  Further work simulated lifting review focusing on neutral spine; Progress squatting/lifting activities for carryover with job related tasks     Consulted and Agree with Plan of Care  Patient       Patient will benefit from skilled therapeutic intervention in order to improve the following deficits and impairments:  Decreased activity tolerance, Decreased  endurance, Decreased range of motion, Decreased strength, Impaired  flexibility, Pain, Increased muscle spasms, Difficulty walking  Visit Diagnosis: Acute left-sided low back pain with left-sided sciatica  Difficulty walking     Problem List Patient Active Problem List   Diagnosis Date Noted  . 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 Smoker 09/23/2013    Bess Harvest, PTA 06/13/18 8:39 AM   [facility] [address]  [place], [place], [address] Phone: [phone]   Fax:  [phone]  Name: Larry Ross MRN: [id number] Date of Birth: Nov 17, 1990

## 2018-06-26 ENCOUNTER — Ambulatory Visit: Payer: 59 | Attending: Family Medicine | Admitting: Physical Therapy

## 2018-06-26 DIAGNOSIS — M5442 Lumbago with sciatica, left side: Secondary | ICD-10-CM | POA: Diagnosis not present

## 2018-06-26 DIAGNOSIS — R262 Difficulty in walking, not elsewhere classified: Secondary | ICD-10-CM | POA: Insufficient documentation

## 2018-06-26 NOTE — Therapy (Signed)
[facility] [address]  [place], [place], [address] Phone: [phone]   Fax:  [phone]  Physical Therapy Treatment/Recertification  Patient Details  Name: Larry Ross MRN: [id number] Date of Birth: 04-03-1991 Referring Provider (PT): Karlton Lemon. MD   Encounter Date: 06/26/2018  PT End of Session - 06/26/18 0948    Visit Number  9    Number of Visits  16    Date for PT Re-Evaluation  07/24/18    Authorization Type  UHC    PT Start Time  0940   pt 10 min late   PT Stop Time  1020    PT Time Calculation (min)  40 min    Activity Tolerance  Patient tolerated treatment well    Behavior During Therapy  [facility] for tasks assessed/performed       No past medical history on file.  No past surgical history on file.  There were no vitals filed for this visit.  Subjective Assessment - 06/26/18 0942    Subjective  Pt. reporting LBP/R hip pain and R LE sharp pain "flare-up over weekend without known trigger.     Diagnostic tests  MRI negative    Patient Stated Goals  Get rid of the pain    Currently in Pain?  Yes    Pain Score  5     Pain Location  Back    Pain Orientation  Right;Lower    Pain Descriptors / Indicators  Aching    Pain Type  Acute pain    Pain Radiating Towards  Was radiating sharp pain down R LE over weekend however denies today     Pain Onset  In the past 7 days    Pain Frequency  Intermittent    Aggravating Factors   running     Pain Relieving Factors  medication     Multiple Pain Sites  No         [facility] PT Assessment - 06/26/18 0001      Assessment   Medical Diagnosis  LBP with Lt sciatica    Referring Provider (PT)  Shane Hudnall. MD    Onset Date/Surgical Date  --   3 month onset of pain   Next MD Visit  not scheduled yet      AROM   Lumbar Flexion  mid shins     Lumbar Extension  25% limited     Lumbar - Right Side Bend  WNL    Lumbar - Left Side Bend  WNL    Lumbar - Right Rotation  WNL    Lumbar - Left Rotation  WNL      Strength   Overall Strength Comments  Lt hip and Rt hip 4/5 MMT grossly, limited by pain                   [facility] Adult PT Treatment/Exercise - 06/26/18 0001      Lumbar Exercises: Stretches   Passive Hamstring Stretch  Right;Left;2 reps;30 seconds    Passive Hamstring Stretch Limitations  strap     Single Knee to Chest Stretch  30 seconds;2 reps;Left;Right    Piriformis Stretch  Left;Right;2 reps;30 seconds    Piriformis Stretch Limitations  KTOS    Figure 4 Stretch  30 seconds;2 reps;Supine;With overpressure    Figure 4 Stretch Limitations  knees to chest      Lumbar Exercises: Aerobic   Nustep  L5 x 6 min (LE only)  Lumbar Exercises: Standing   Functional Squats  15 reps;3 seconds    Functional Squats Limitations  at counter    Other Standing Lumbar Exercises  Rows and shldr ext with green X 20 ea with core      Lumbar Exercises: Prone   Other Prone Lumbar Exercises  POE x 20 sec       Modalities   Modalities  Electrical Stimulation;Moist Heat      Moist Heat Therapy   Number Minutes Moist Heat  15 Minutes    Moist Heat Location  Lumbar Spine      Electrical Stimulation   Electrical Stimulation Location  Lumbar spine & R glutes/piriformis    Electrical Stimulation Action  IFC    Electrical Stimulation Parameters  80-150    Electrical Stimulation Goals  Pain                  PT Long Term Goals - 06/26/18 1008      PT LONG TERM GOAL #1   Title  Pt will be I and compliant with HEP. 6 weeks 06/12/18    Status  Achieved      PT LONG TERM GOAL #2   Title  Pt will improve FOTO to less than 37% limited. 6 weeks 06/12/18    Status  Partially Met      PT LONG TERM GOAL #3   Title  Pt will improve lumbar AROM and Lt hip AROM to WNL. 6 weeks 06/12/18    Baseline  now only limited with flex/ext    Status  Partially Met      PT LONG TERM GOAL #4   Title  Pt will be able to  return to work performing his usual tasks of lifting and walking, with less than 2/-3/10 overall pain. 6 weeks 06/12/18    Baseline  overall 5/10 pain    Status  Partially Met            Plan - 06/26/18 1009    Clinical Impression Statement  Recert performed today as he is now out of POC. He has made some progress with LE strength, lumbar ROM, activity tolerance, and pain levels however he still has deficits in these areas and he had recent flare up last week in his pain. He still has pain with working and lifitng and he will benefit from additional PT to address these deficits. PT is recommending 1-2 visits per week for 4 more weeks.     Rehab Potential  Good    Clinical Impairments Affecting Rehab Potential  MRI neg    PT Frequency  Other (comment)   1-2   PT Duration  4 weeks    PT Treatment/Interventions  Cryotherapy;Electrical Stimulation;Iontophoresis 4mg/ml Dexamethasone;Moist Heat;Traction;Ultrasound;Therapeutic activities;Therapeutic exercise;Neuromuscular re-education;Manual techniques;Passive range of motion;Dry needling;Taping;Spinal Manipulations;Joint Manipulations    PT Next Visit Plan  Further work simulated lifting review focusing on neutral spine; Progress squatting/lifting activities for carryover with job related tasks     Consulted and Agree with Plan of Care  Patient       Patient will benefit from skilled therapeutic intervention in order to improve the following deficits and impairments:  Decreased activity tolerance, Decreased endurance, Decreased range of motion, Decreased strength, Impaired flexibility, Pain, Increased muscle spasms, Difficulty walking  Visit Diagnosis: Acute left-sided low back pain with left-sided sciatica  Difficulty walking     Problem List Patient Active Problem List   Diagnosis Date Noted  . 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 Smoker   09/23/2013    Brian R Nelson, PT,DPT 06/26/2018, 10:12 AM  [facility]  [facility] [place] [address]  [address] [place], [place], [address] Phone: [phone]   Fax:  [phone]  Name: Gilles Freddy Hussey MRN: [id number] Date of Birth: 08/04/1990   

## 2018-07-03 ENCOUNTER — Ambulatory Visit: Payer: 59 | Admitting: Physical Therapy

## 2018-07-03 ENCOUNTER — Encounter: Payer: Self-pay | Admitting: Physical Therapy

## 2018-07-03 DIAGNOSIS — R262 Difficulty in walking, not elsewhere classified: Secondary | ICD-10-CM

## 2018-07-03 DIAGNOSIS — M5442 Lumbago with sciatica, left side: Secondary | ICD-10-CM

## 2018-07-03 NOTE — Therapy (Addendum)
[facility] [place] [address]  [place], [place], [address] Phone: [phone]   Fax:  [phone]  Physical Therapy Treatment Addendum to complete subjective assessment. Patient Details  Name: Larry Ross MRN: [id number] Date of Birth: 1991/02/13 Referring Provider (PT): Karlton Lemon. MD   Encounter Date: 07/03/2018  PT End of Session - 07/03/18 0944    Visit Number  10    Number of Visits  16    Date for PT Re-Evaluation  07/24/18    Authorization Type  UHC    PT Start Time  [phone]   pt 9 min late   PT Stop Time  1020    PT Time Calculation (min)  41 min    Activity Tolerance  Patient tolerated treatment well    Behavior During Therapy  [facility] for tasks assessed/performed       History reviewed. No pertinent past medical history.  History reviewed. No pertinent surgical history.  There were no vitals filed for this visit.  Subjective Assessment - 07/03/18 0941    Subjective  Pt relays his back is doing a lot better     2/10 LBP in the middle that is now intermittent and aggravated by prolonged positions and relieved with rest. He denies any leg pain today.                  [facility] Adult PT Treatment/Exercise - 07/03/18 0001      Exercises   Exercises  Lumbar      Lumbar Exercises: Stretches   Passive Hamstring Stretch  Right;Left;2 reps;30 seconds    Passive Hamstring Stretch Limitations  strap     Piriformis Stretch  Left;Right;2 reps;30 seconds    Piriformis Stretch Limitations  KTOS      Lumbar Exercises: Aerobic   Nustep  L5 x 6 min (LE only)      Lumbar Exercises: Machines for Strengthening   Cybex Knee Extension  B LE's 20# x 2X10 reps     Cybex Knee Flexion  B LE's 20# x 2X10 reps       Lumbar Exercises: Standing   Functional Squats  20 reps    Functional Squats Limitations  TRX 3 sec hold    Lifting  5 reps;From floor    Lifting Weights (lbs)  box only 15-20 lbs    Lifting Limitations  focus on mechanics, floor to counter    Other Standing Lumbar Exercises  Rows and shldr ext with green X 20 ea with core      Lumbar Exercises: Prone   Other Prone Lumbar Exercises  POE x 30 sec       Modalities   Modalities  Electrical Stimulation;Moist Heat      Moist Heat Therapy   Number Minutes Moist Heat  15 Minutes    Moist Heat Location  Lumbar Spine      Electrical Stimulation   Electrical Stimulation Location  Lumbar spine    Electrical Stimulation Action  IFC    Electrical Stimulation Parameters  80-150    Electrical Stimulation Goals  Pain             PT Education - 07/03/18 1009    Education Details  lifitng mechanics    Person(s) Educated  Patient    Methods  Explanation;Demonstration;Verbal cues    Comprehension  Verbalized understanding;Returned demonstration          PT Long Term Goals - 06/26/18 1008  PT LONG TERM GOAL #1   Title  Pt will be I and compliant with HEP. 6 weeks 06/12/18    Status  Achieved      PT LONG TERM GOAL #2   Title  Pt will improve FOTO to less than 37% limited. 6 weeks 06/12/18    Status  Partially Met      PT LONG TERM GOAL #3   Title  Pt will improve lumbar AROM and Lt hip AROM to WNL. 6 weeks 06/12/18    Baseline  now only limited with flex/ext    Status  Partially Met      PT LONG TERM GOAL #4   Title  Pt will be able to return to work performing his usual tasks of lifting and walking, with less than 2/-3/10 overall pain. 6 weeks 06/12/18    Baseline  overall 5/10 pain    Status  Partially Met            Plan - 07/03/18 1010    Clinical Impression Statement  Pt had less overall pain today and was able to progress leg strength and functional lifitng/squatting today without complaints. PT will continue to progress this as able.     Rehab Potential  Good    Clinical Impairments Affecting Rehab Potential  MRI neg    PT Frequency  Other (comment)   1-2   PT Treatment/Interventions   Cryotherapy;Electrical Stimulation;Iontophoresis 41m/ml Dexamethasone;Moist Heat;Traction;Ultrasound;Therapeutic activities;Therapeutic exercise;Neuromuscular re-education;Manual techniques;Passive range of motion;Dry needling;Taping;Spinal Manipulations;Joint Manipulations    PT Next Visit Plan  Further work simulated lifting review focusing on neutral spine; Progress squatting/lifting activities for carryover with job related tasks     Consulted and Agree with Plan of Care  Patient       Patient will benefit from skilled therapeutic intervention in order to improve the following deficits and impairments:  Decreased activity tolerance, Decreased endurance, Decreased range of motion, Decreased strength, Impaired flexibility, Pain, Increased muscle spasms, Difficulty walking  Visit Diagnosis: Acute left-sided low back pain with left-sided sciatica  Difficulty walking     Problem List Patient Active Problem List   Diagnosis Date Noted  . 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 Smoker 09/23/2013    BDebbe Odea, PT,DPT 07/03/2018, 10:12 AM  [facility] [place] [address]Phone: [phone]  Fax:  [phone] Name: Larry MilleaMRN: [id number] of Birth: [birth year]

## 2018-07-10 ENCOUNTER — Ambulatory Visit: Payer: 59 | Admitting: Physical Therapy

## 2018-07-10 DIAGNOSIS — M5442 Lumbago with sciatica, left side: Secondary | ICD-10-CM | POA: Diagnosis not present

## 2018-07-10 DIAGNOSIS — R262 Difficulty in walking, not elsewhere classified: Secondary | ICD-10-CM

## 2018-07-10 NOTE — Therapy (Addendum)
[facility] [address]  [place], [place], [address] Phone: [phone]   Fax:  [phone]  Physical Therapy Treatment / Discharge Summary  Patient Details  Name: Larry Ross MRN: [id number] Date of Birth: 12-Jun-1991 Referring Provider (PT): Karlton Lemon. MD   Encounter Date: 07/10/2018  PT End of Session - 07/10/18 1038    Visit Number  11    Number of Visits  16    Date for PT Re-Evaluation  07/24/18    Authorization Type  UHC    PT Start Time  0940   pt late   PT Stop Time  1035    PT Time Calculation (min)  55 min    Activity Tolerance  Patient tolerated treatment well;Patient limited by pain   tolerated well except for box lift caused knee pain   Behavior During Therapy  [facility] for tasks assessed/performed       No past medical history on file.  No past surgical history on file.  There were no vitals filed for this visit.  Subjective Assessment - 07/10/18 1002    Subjective  Pt relays back is doing better but his Lt leg is bothering him, his knee feels tight like it might buckle    Currently in Pain?  Yes    Pain Score  3     Pain Location  Leg    Pain Orientation  Left    Pain Descriptors / Indicators  Tightness;Aching    Pain Type  Acute pain    Pain Onset  Yesterday    Pain Frequency  Intermittent    Multiple Pain Sites  No                       [facility] Adult PT Treatment/Exercise - 07/10/18 0001      Lumbar Exercises: Stretches   Passive Hamstring Stretch  Right;Left;2 reps;30 seconds    Passive Hamstring Stretch Limitations  strap     Press Ups  10 reps   2 sets   Quad Stretch  Left;3 reps;30 seconds    Quad Stretch Limitations  prone with strap    Piriformis Stretch  Left;Right;2 reps;30 seconds    Piriformis Stretch Limitations  KTOS      Lumbar Exercises: Aerobic   Recumbent Bike  L2 X6 min      Lumbar Exercises: Machines for Strengthening   Cybex Knee  Extension  B LE's 25# x 2X10 reps     Cybex Knee Flexion  B LE's 35# x 2X10 reps       Lumbar Exercises: Standing   Functional Squats  20 reps    Functional Squats Limitations  TRX 3 sec hold    Lifting  10 reps;From floor    Lifting Weights (lbs)  box only 15-20 lbs    Lifting Limitations  had to stop after 3 reps due to knee pain    Other Standing Lumbar Exercises  Rows and shldr ext with green X 25 ea with core      Modalities   Modalities  Electrical Stimulation;Moist Heat      Moist Heat Therapy   Number Minutes Moist Heat  15 Minutes    Moist Heat Location  Lumbar Spine;Knee      Electrical Stimulation   Electrical Stimulation Location  Lt lumbar and Lt knee    Electrical Stimulation Action  pre mod to both    Electrical Stimulation Parameters  tolerance    Electrical Stimulation Goals  Pain                  PT Long Term Goals - 06/26/18 1008      PT LONG TERM GOAL #1   Title  Pt will be I and compliant with HEP. 6 weeks 06/12/18    Status  Achieved      PT LONG TERM GOAL #2   Title  Pt will improve FOTO to less than 37% limited. 6 weeks 06/12/18    Status  Partially Met      PT LONG TERM GOAL #3   Title  Pt will improve lumbar AROM and Lt hip AROM to WNL. 6 weeks 06/12/18    Baseline  now only limited with flex/ext    Status  Partially Met      PT LONG TERM GOAL #4   Title  Pt will be able to return to work performing his usual tasks of lifting and walking, with less than 2/-3/10 overall pain. 6 weeks 06/12/18    Baseline  overall 5/10 pain    Status  Partially Met            Plan - 07/10/18 1040    Clinical Impression Statement  Pt able to progress therex today except for box lift which caused knee pain thus it was stopped after 3 reps. He was given quad stretch to add in due to complaints of Lt knee tightness. He again recieved MHP and IFC estim to lumbar and lt knee as he relays benefit from this.     Rehab Potential  Good    Clinical  Impairments Affecting Rehab Potential  MRI neg    PT Frequency  1x / week    PT Duration  4 weeks    PT Treatment/Interventions  Cryotherapy;Electrical Stimulation;Iontophoresis 51m/ml Dexamethasone;Moist Heat;Traction;Ultrasound;Therapeutic activities;Therapeutic exercise;Neuromuscular re-education;Manual techniques;Passive range of motion;Dry needling;Taping;Spinal Manipulations;Joint Manipulations    PT Next Visit Plan  Further work simulated lifting review focusing on neutral spine; Progress squatting/lifting activities for carryover with job related tasks     Consulted and Agree with Plan of Care  Patient       Patient will benefit from skilled therapeutic intervention in order to improve the following deficits and impairments:  Decreased activity tolerance, Decreased endurance, Decreased range of motion, Decreased strength, Impaired flexibility, Pain, Increased muscle spasms, Difficulty walking  Visit Diagnosis: Acute left-sided low back pain with left-sided sciatica  Difficulty walking     Problem List Patient Active Problem List   Diagnosis Date Noted  . 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 Smoker 09/23/2013    BSilvestre Mesi12/18/2019, 11:01 AM  [facility] [place] [address]Phone: [phone]  Fax:  [phone] Name: Larry EnckMRN: [id number] of Birth: 4January 04, 1992 PHYSICAL THERAPY DISCHARGE SUMMARY  Visits from Start of Care: 11  Current functional level related to goals / functional outcomes:   Refer to above note for status as of last visit on 07/10/18. Pt failed to schedule any further visits in the past 30 days, therefore will proceed with discharge from PT for this episode.   Remaining deficits:   Unable to assess due to failure to return.   Education / Equipment:   HEP  Plan: Patient agrees to discharge.  Patient goals were partially met.  Patient is being discharged due to not returning since the last visit.  ?????  Percival Spanish, PT, MPT 08/12/18, 8:29 AM  [facility] [place], [place], [address] Phone: [phone]   Fax:  [phone]

## 2019-12-04 IMAGING — DX DG LUMBAR SPINE 2-3V
3 series · 3 of 3 positions shown · non-contrast
Comparison: CT of the abdomen and pelvis on 11/04/2011

CLINICAL DATA: Pt c/o lower back pain x 2 months which radiates
down left leg. Denies injury. Pt is unable to stand.

EXAM:
LUMBAR SPINE - 2-3 VIEW

[l-spine ap]
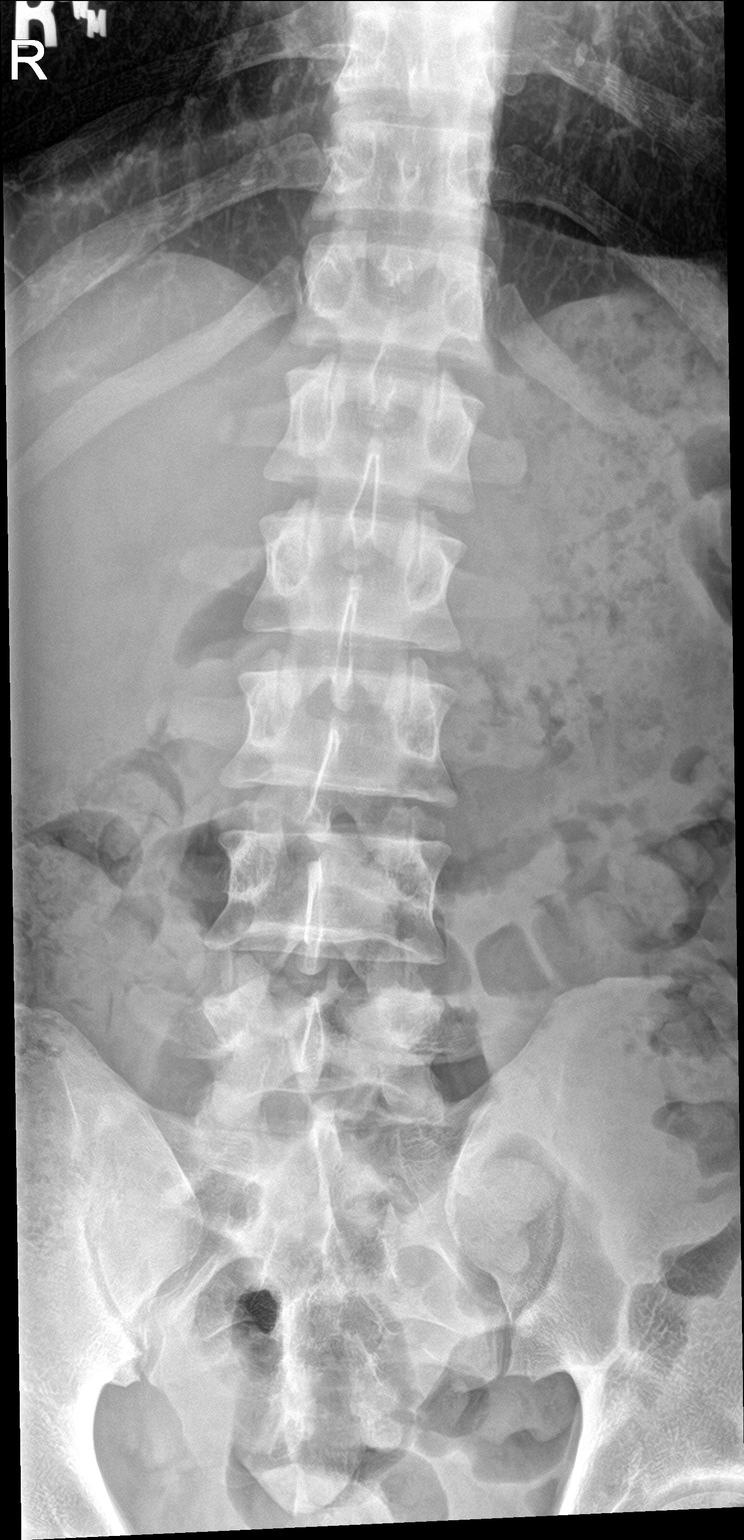

[l-spine lat]
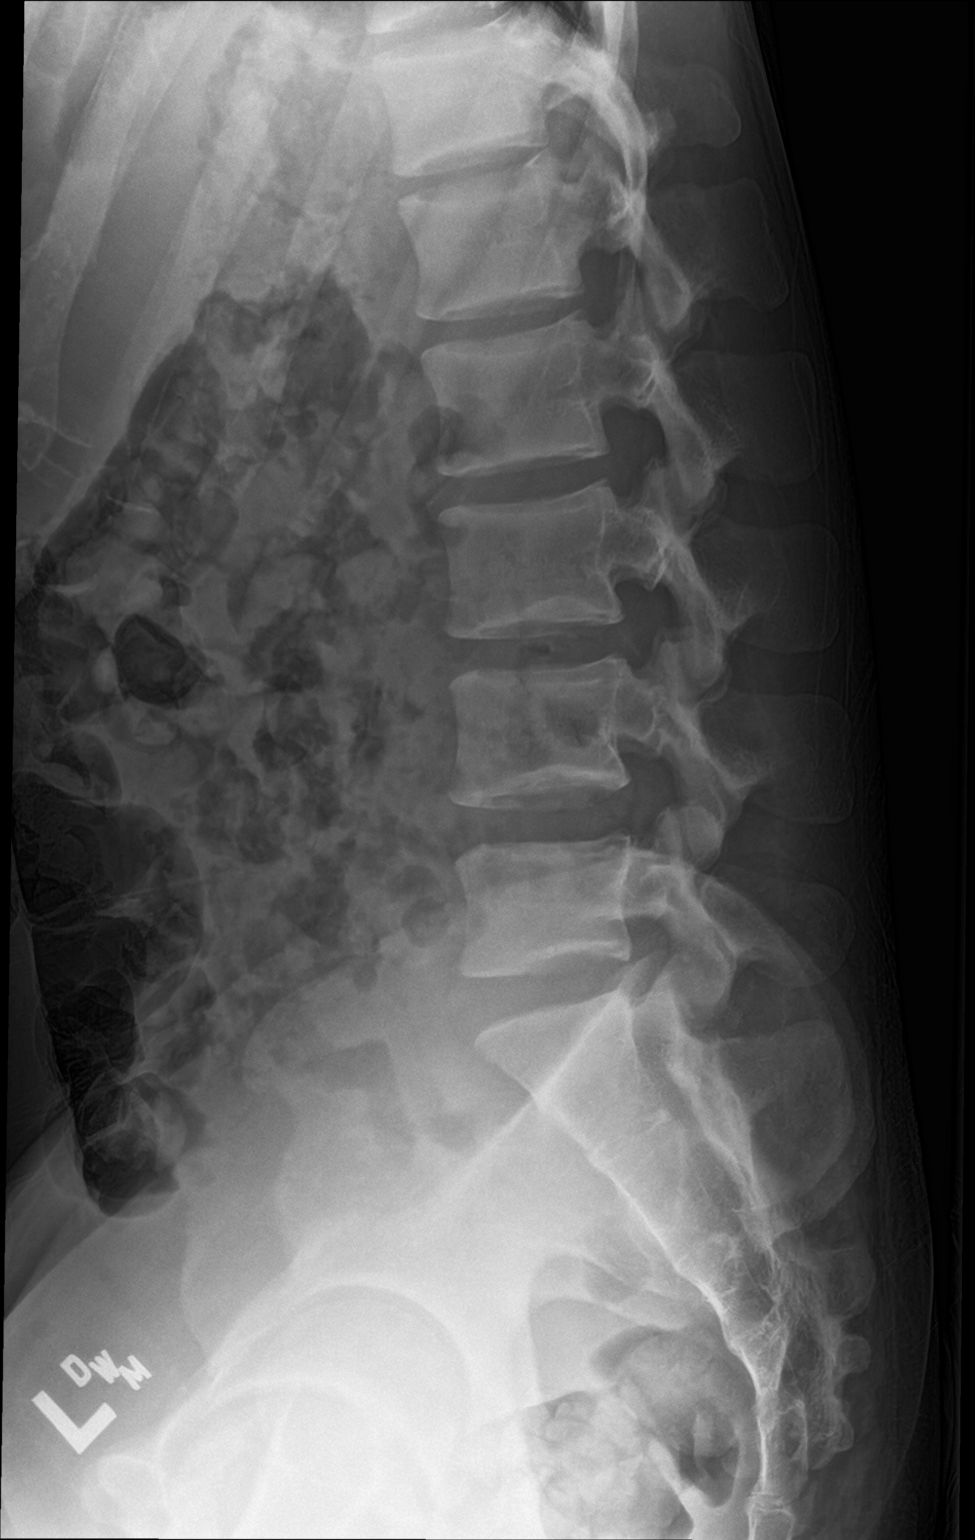

[l-spine spot]
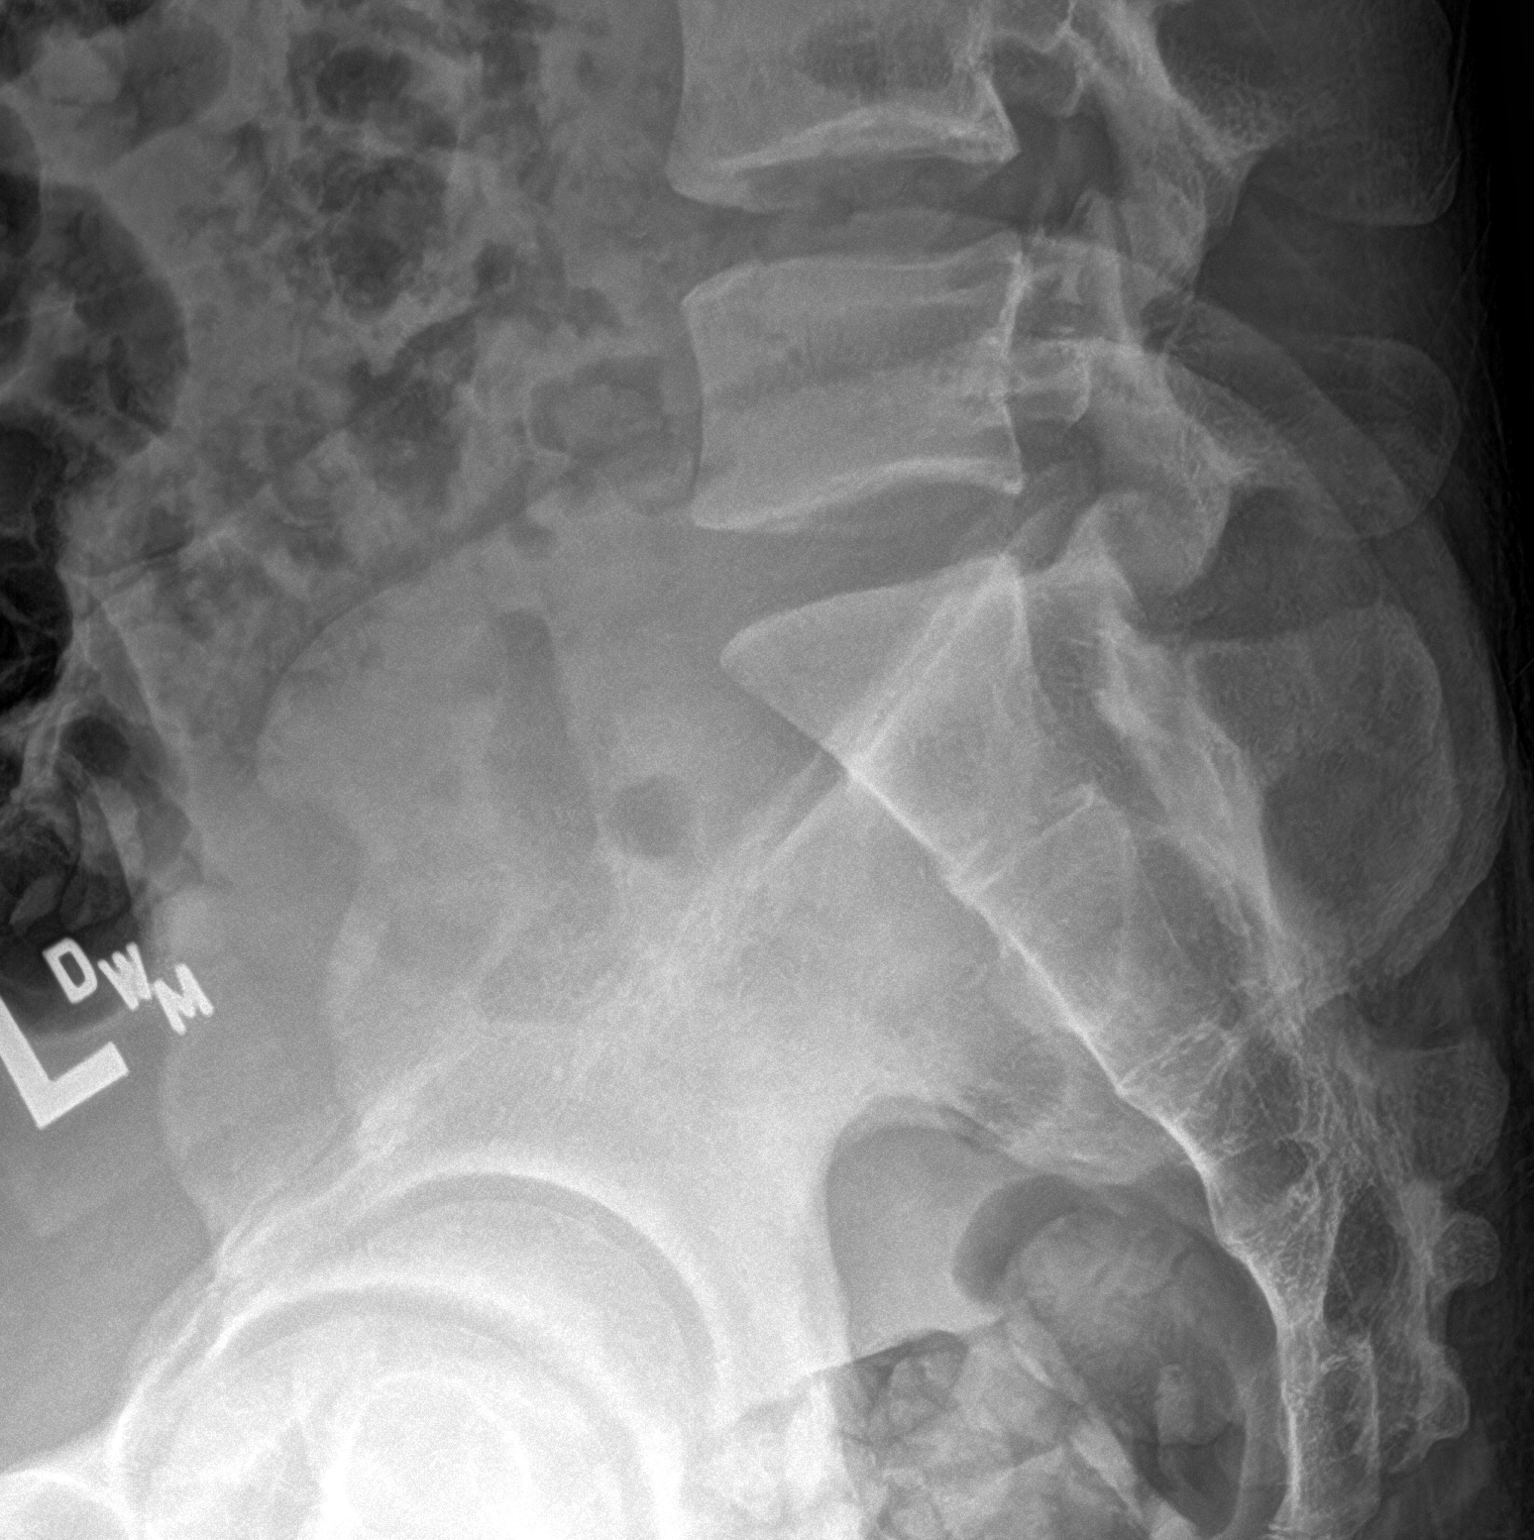

[3 of 3 positions shown; findings below may reference images not displayed]

FINDINGS: There is loss of lumbar lordosis. No acute fracture or subluxation.
No lytic or blastic lesions.
IMPRESSION: Loss of lordosis.  Otherwise, negative exam.

## 2019-12-27 IMAGING — MR MR LUMBAR SPINE W/O CM
4 of 5 series · 27 of 48 positions shown · non-contrast
Comparison: None.

CLINICAL DATA: Low back pain radiating into the left leg for 6
weeks

EXAM:
MRI LUMBAR SPINE WITHOUT CONTRAST
TECHNIQUE: Multiplanar, multisequence MR imaging of the lumbar spine was
performed. No intravenous contrast was administered.

[Series 3: T2 post-contrast · sagittal · 4.0mm · 0.55mm/px · 6 of 13 slices shown]
[im 1/13]
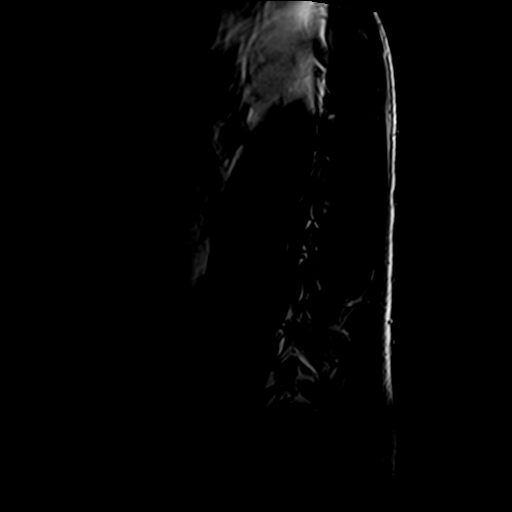
[im 3/13]
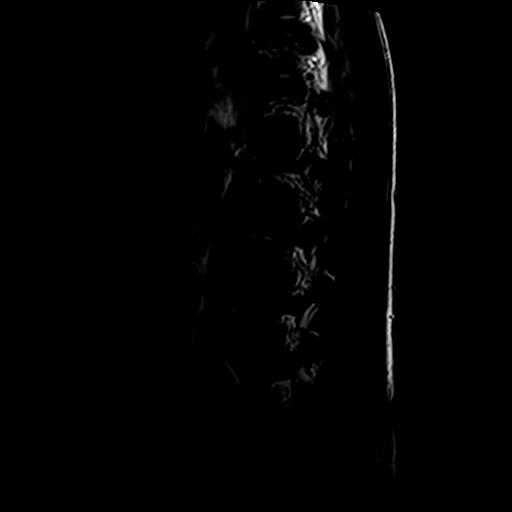
[im 5/13]
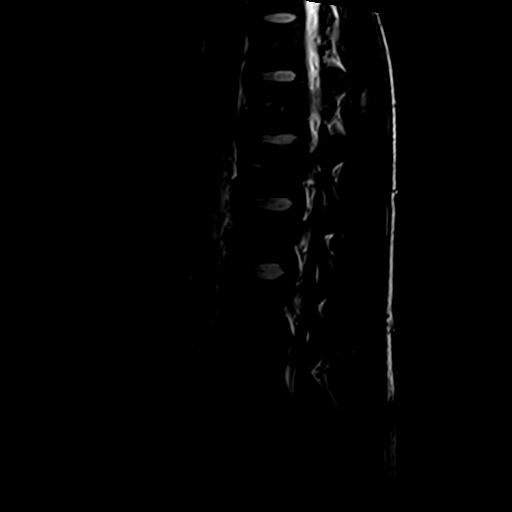
[im 8/13]
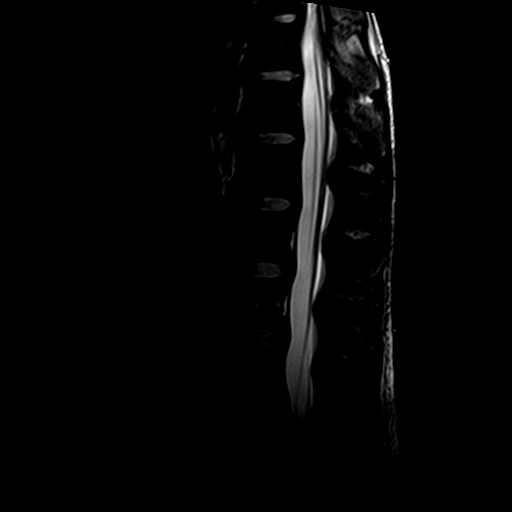
[im 10/13]
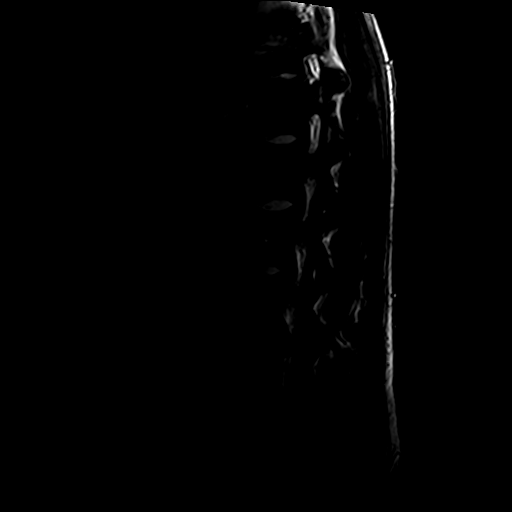
[im 13/13]
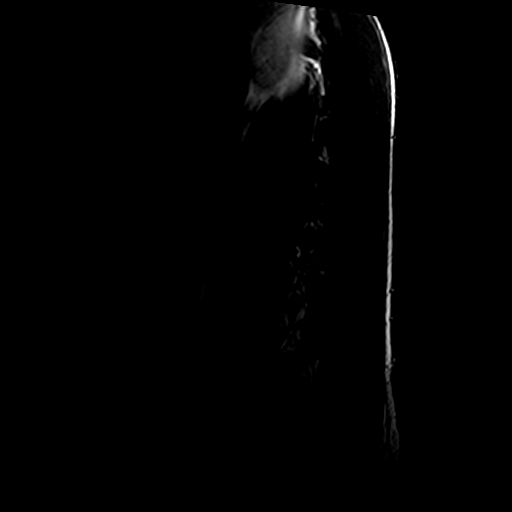

[Series 4: T1 · sagittal · 4.0mm · 0.55mm/px · 5 of 13 slices shown (1 of 2)]
[im 1/13]
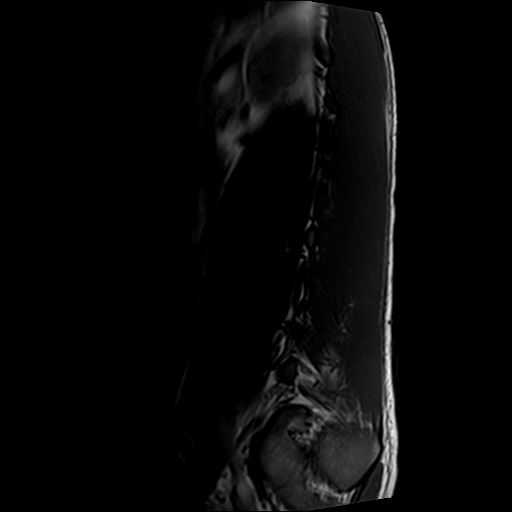
[im 4/13]
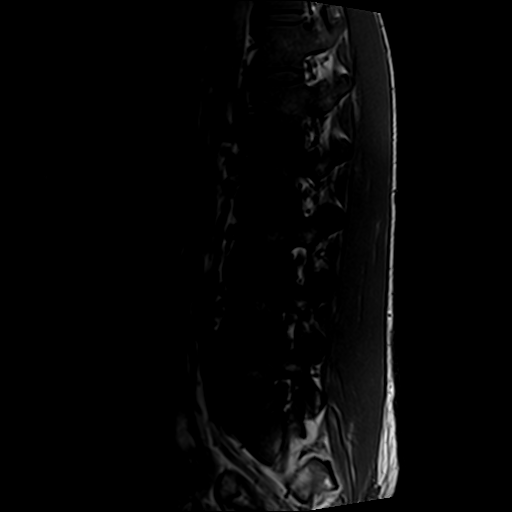
[im 7/13]
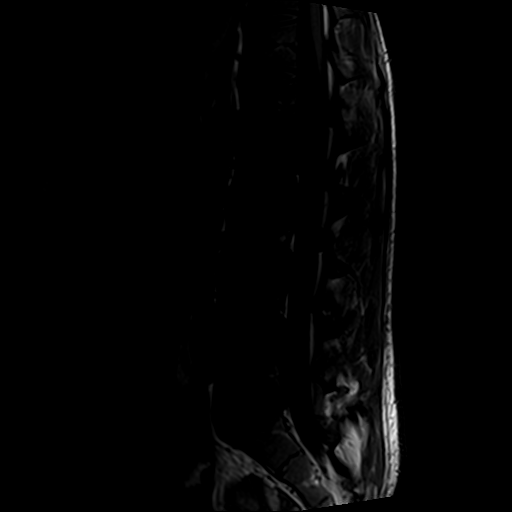
[im 10/13]
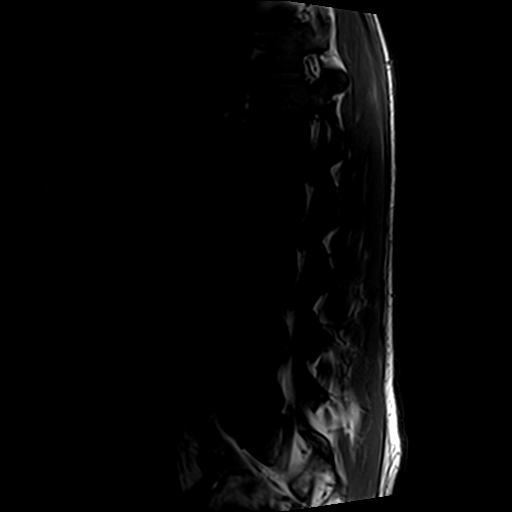
[im 13/13]
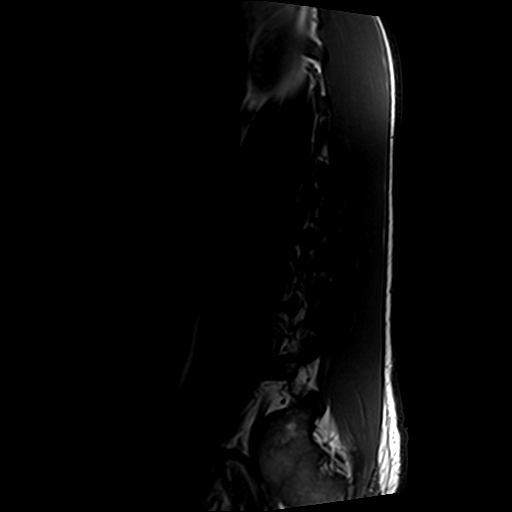

[Series 6: T1 · axial · 4.0mm · 0.35mm/px · z∈[-134,+38]mm · 6 of 39 slices shown (2 of 2)]
[im 3/39]
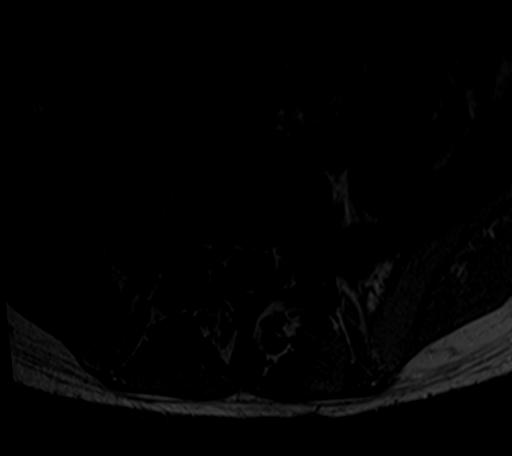
[im 6/39]
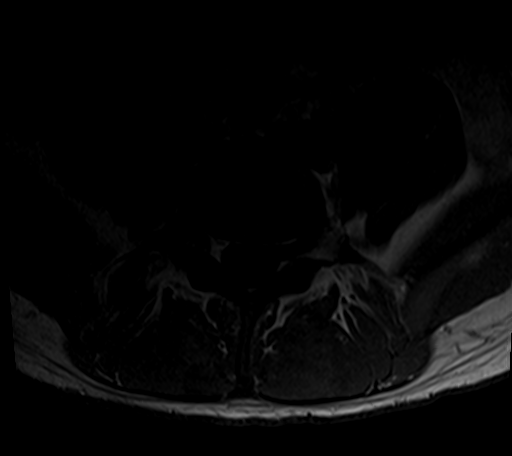
[im 8/39]
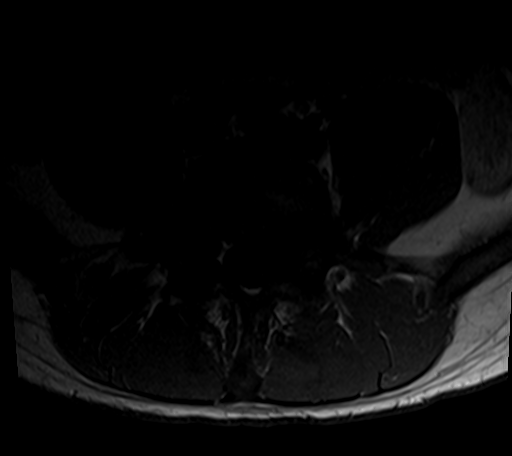
[im 13/39]
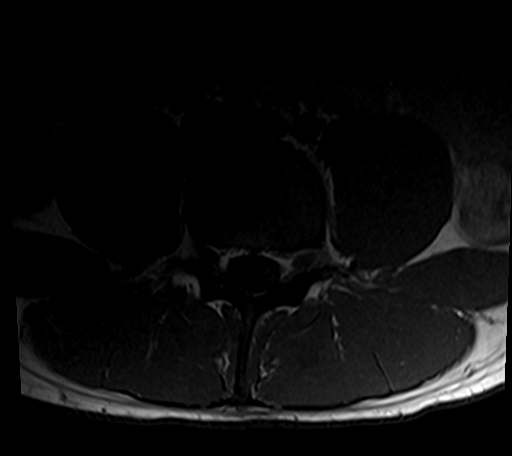
[im 21/39]
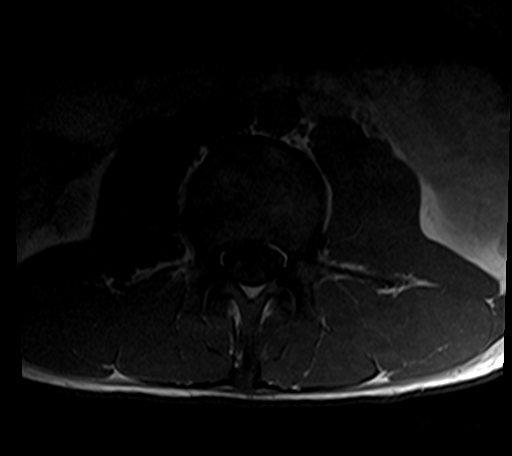
[im 33/39]
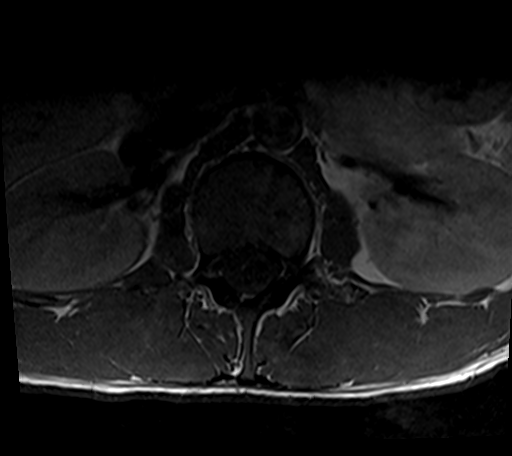

[Series 7: T2 · axial · 4.0mm · 0.70mm/px · z∈[-134,+69]mm · 10 of 39 slices shown]
[im 3/39]
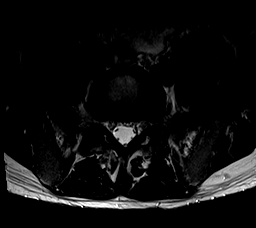
[im 6/39]
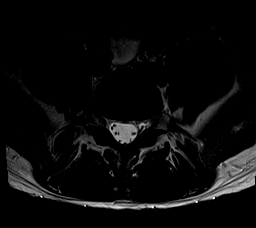
[im 8/39]
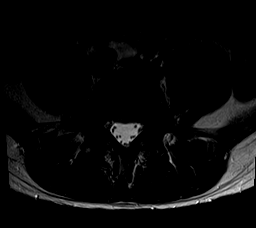
[im 13/39]
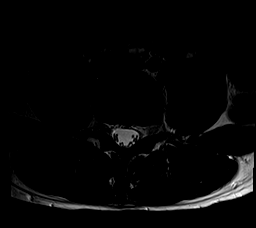
[im 18/39]
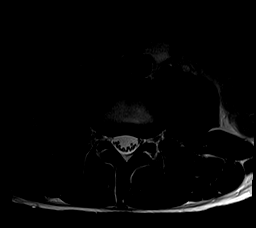
[im 21/39]
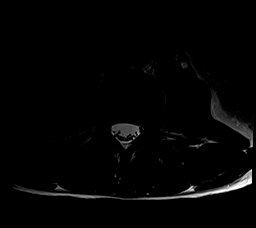
[im 23/39]
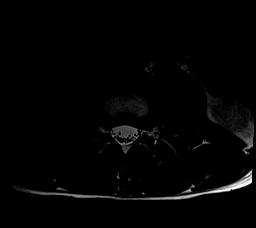
[im 28/39]
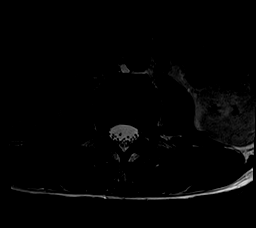
[im 33/39]
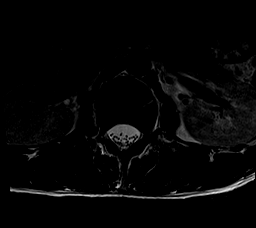
[im 39/39]
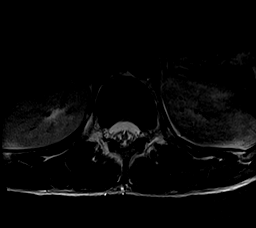

[27 of 48 positions shown; findings below may reference images not displayed]

FINDINGS: Segmentation:  Standard.

Alignment:  Physiologic.

Vertebrae:  No fracture, evidence of discitis, or bone lesion.

Conus medullaris and cauda equina: Conus extends to the T12 level.
Conus and cauda equina appear normal.

Paraspinal and other soft tissues: No acute paraspinal abnormality.

Disc levels:

Disc spaces: Disc spaces are preserved.

T12-L1: No significant disc bulge. No evidence of neural foraminal
stenosis. No central canal stenosis.

L1-L2: No significant disc bulge. No evidence of neural foraminal
stenosis. No central canal stenosis.

L2-L3: No significant disc bulge. No evidence of neural foraminal
stenosis. No central canal stenosis.

L3-L4: No significant disc bulge. No evidence of neural foraminal
stenosis. No central canal stenosis.

L4-L5: Minimal broad-based disc bulge. No evidence of neural
foraminal stenosis. No central canal stenosis.

L5-S1: No significant disc bulge. No evidence of neural foraminal
stenosis. No central canal stenosis.
IMPRESSION: 1. No significant disc protrusion, foraminal stenosis or central
canal stenosis.

## 2020-04-11 ENCOUNTER — Encounter (HOSPITAL_BASED_OUTPATIENT_CLINIC_OR_DEPARTMENT_OTHER): Payer: Self-pay | Admitting: Emergency Medicine

## 2020-04-11 ENCOUNTER — Other Ambulatory Visit: Payer: Self-pay

## 2020-04-11 DIAGNOSIS — M79662 Pain in left lower leg: Secondary | ICD-10-CM | POA: Insufficient documentation

## 2020-04-11 DIAGNOSIS — M79661 Pain in right lower leg: Secondary | ICD-10-CM | POA: Insufficient documentation

## 2020-04-11 DIAGNOSIS — F17210 Nicotine dependence, cigarettes, uncomplicated: Secondary | ICD-10-CM | POA: Insufficient documentation

## 2020-04-11 DIAGNOSIS — Z79899 Other long term (current) drug therapy: Secondary | ICD-10-CM | POA: Insufficient documentation

## 2020-04-11 NOTE — ED Triage Notes (Signed)
States he has had a lingering problem for 2 years with stiffness in his lower back. Has been seen for this and told it was Sciatica. States today he is having weakness in BIL legs since 1900. States " I guess I had too much alcohol watching football and my legs ache real bad". He reports that he had "a few shots" today.

## 2020-04-12 ENCOUNTER — Emergency Department (HOSPITAL_BASED_OUTPATIENT_CLINIC_OR_DEPARTMENT_OTHER)
Admission: EM | Admit: 2020-04-12 | Discharge: 2020-04-12 | Disposition: A | Discharge status: Home / Self Care | Payer: 59 | Attending: Emergency Medicine | Admitting: Emergency Medicine

## 2020-04-12 DIAGNOSIS — R252 Cramp and spasm: Secondary | ICD-10-CM

## 2020-04-12 LAB — BASIC METABOLIC PANEL
Anion gap: 9 (ref 5–15)
BUN: 20 mg/dL (ref 6–20)
CO2: 28 mmol/L (ref 22–32)
Calcium: 8.8 mg/dL — ABNORMAL LOW (ref 8.9–10.3)
Chloride: 102 mmol/L (ref 98–111)
Creatinine, Ser: 0.91 mg/dL (ref 0.61–1.24)
GFR calc Af Amer: >60 mL/min (ref >60)
GFR calc non Af Amer: >60 mL/min (ref >60)
Glucose, Bld: 103 mg/dL — ABNORMAL HIGH (ref 70–99)
Potassium: 4.3 mmol/L (ref 3.5–5.1)
Sodium: 139 mmol/L (ref 135–145)

## 2020-04-12 MED ORDER — NAPROXEN 250 MG PO TABS
500.0000 mg | ORAL_TABLET | Freq: Once | ORAL | Status: AC
  Administered 2020-04-12: 500 mg via ORAL
  Filled 2020-04-12: qty 2

## 2020-04-12 MED ORDER — NAPROXEN 500 MG PO TABS: 500.0000 mg | ORAL_TABLET | Freq: Two times a day (BID) | ORAL | 0 refills | Status: DC

## 2020-04-12 NOTE — ED Provider Notes (Signed)
[facility] EMERGENCY DEPARTMENT Provider Note   CSN: [id number] Arrival date & time: 04/11/20  2250     History Chief Complaint  Patient presents with  . Weakness    Larry Ross is a 29 y.o. male.  HPI     This is a 29 year old male with a history of asthma who presents with bilateral leg cramping.  Patient reports onset of symptoms after drinking several drinks this afternoon.  He states he has issues with leg cramping and pain on and off.  Pain is in the bilateral lower extremities and he states it makes it difficult for him to walk.  He reports that he feels like he has stayed hydrated.  No recent extreme exertion or workouts.  At baseline he reports he has some lower back pain that has been ongoing for the last 2 years.  He denies any radiation of the pain down one leg or the other.  Denies bowel or bladder difficulty.  Denies weakness, numbness, tingling.  Patient rates his pain 8 out of 10.  History reviewed. No pertinent past medical history.  Patient Active Problem List   Diagnosis Date Noted  . 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 Smoker 09/23/2013    Past Surgical History:  Procedure Laterality Date  . SKIN BIOPSY         Family History  Problem Relation Age of Onset  . Asthma Neg Hx     Social History   Tobacco Use  . Smoking status: Current Some Day Smoker    Packs/day: 0.25    Years: 0.50    Pack years: 0.12    Types: Cigarettes  . Smokeless tobacco: Never Used  . Tobacco comment: hookah occ   Substance Use Topics  . Alcohol use: Yes    Comment: "on weekends"  . Drug use: No    Home Medications Prior to Admission medications   Medication Sig Start Date End Date Taking? Authorizing Provider  albuterol (VENTOLIN HFA) 108 (90 BASE) MCG/ACT inhaler Inhale 2 puffs into the lungs every 4 (four) hours as needed for wheezing or shortness of breath.    [provider]  budesonide-formoterol (SYMBICORT) 80-4.5 MCG/ACT inhaler  Take 2 puffs first thing in am and then another 2 puffs about 12 hours later. Patient not taking: Reported on 04/17/2018 10/20/13   Nyoka Cowden, MD  naproxen (NAPROSYN) 500 MG tablet Take 1 tablet (500 mg total) by mouth 2 (two) times daily as needed. 04/17/18   Hudnall, Azucena Fallen, MD  naproxen (NAPROSYN) 500 MG tablet Take 1 tablet (500 mg total) by mouth 2 (two) times daily. 04/12/20   Donshay Lupinski, Mayer Masker, MD  nortriptyline (PAMELOR) 25 MG capsule Take 1 capsule (25 mg total) by mouth at bedtime. 04/17/18   Hudnall, Azucena Fallen, MD  tiZANidine (ZANAFLEX) 4 MG tablet Take 1 tablet (4 mg total) by mouth every 8 (eight) hours as needed. Patient not taking: Reported on 05/08/2018 04/17/18   Lenda Kelp, MD    Allergies    Patient has no known allergies.  Review of Systems   Review of Systems  Constitutional: Negative for fever.  Respiratory: Negative for shortness of breath.   Cardiovascular: Negative for chest pain and leg swelling.  Musculoskeletal:       Leg pain  All other systems reviewed and are negative.   Physical Exam Updated Vital Signs BP (!) 117/58   Pulse (!) 57   Temp 98 F (36.7 C)   Resp 14  Ht 1.854 m (6\' 1" )   Wt 72.6 kg   SpO2 99%   BMI 21.11 kg/m   Physical Exam Vitals and nursing note reviewed.  Constitutional:      Appearance: He is well-developed. He is not ill-appearing.  HENT:     Head: Normocephalic and atraumatic.     Mouth/Throat:     Mouth: Mucous membranes are moist.  Eyes:     Pupils: Pupils are equal, round, and reactive to light.  Cardiovascular:     Rate and Rhythm: Normal rate and regular rhythm.     Heart sounds: Normal heart sounds. No murmur heard.   Pulmonary:     Effort: Pulmonary effort is normal. No respiratory distress.     Breath sounds: Normal breath sounds. No wheezing.  Abdominal:     Palpations: Abdomen is soft.     Tenderness: There is no abdominal tenderness.  Musculoskeletal:        General: No tenderness or  deformity.     Cervical back: Neck supple.     Right lower leg: No edema.     Left lower leg: No edema.  Lymphadenopathy:     Cervical: No cervical adenopathy.  Skin:    General: Skin is warm and dry.  Neurological:     Mental Status: He is alert and oriented to person, place, and time.     Comments: 5 out of 5 strength bilateral lower extremities, no clonus  Psychiatric:        Mood and Affect: Mood normal.     ED Results / Procedures / Treatments   Labs (all labs ordered are listed, but only abnormal results are displayed) Labs Reviewed  BASIC METABOLIC PANEL - Abnormal; Notable for the following components:      Result Value   Glucose, Bld 103 (*)    Calcium 8.8 (*)    All other components within normal limits    EKG None  Radiology No results found.  Procedures Procedures (including critical care time)  Medications Ordered in ED Medications  naproxen (NAPROSYN) tablet 500 mg (500 mg Oral Given 04/12/20 0231)    ED Course  I have reviewed the triage vital signs and the nursing notes.  Pertinent labs & imaging results that were available during my care of the patient were reviewed by me and considered in my medical decision making (see chart for details).    MDM Rules/Calculators/A&P                          Patient presents with bilateral leg cramping.  Overall nontoxic and vital signs are reassuring.  He reports onset of symptoms after some heavy drinking this afternoon.  Considerations include metabolic derangements including hypokalemia.  Less likely rhabdomyolysis.  Doubt it is related to his chronic back pain.  Exam is reassuring and no signs or symptoms of cauda equina.  BMP was obtained to evaluate potassium and renal function.  These are both reassuring.  Recommend anti-inflammatories and ensuring that he is staying hydrated.  Patient stated understanding.  After history, exam, and medical workup I feel the patient has been appropriately medically  screened and is safe for discharge home. Pertinent diagnoses were discussed with the patient. Patient was given return precautions.  Final Clinical Impression(s) / ED Diagnoses Final diagnoses:  Leg cramping    Rx / DC Orders ED Discharge Orders         Ordered    naproxen (NAPROSYN) 500  MG tablet  2 times daily        04/12/20 0231           Renatta Shrieves, Mayer Masker, MD 04/12/20 [phone]

## 2020-04-12 NOTE — Discharge Instructions (Addendum)
You were seen today for bilateral leg cramping.  Your metabolites are reassuring.  Make sure that you are staying well-hydrated.  Take naproxen as needed for pain.

## 2020-06-05 ENCOUNTER — Encounter (HOSPITAL_BASED_OUTPATIENT_CLINIC_OR_DEPARTMENT_OTHER): Payer: Self-pay

## 2020-06-05 ENCOUNTER — Emergency Department (HOSPITAL_BASED_OUTPATIENT_CLINIC_OR_DEPARTMENT_OTHER)
Admission: EM | Admit: 2020-06-05 | Discharge: 2020-06-05 | Disposition: A | Discharge status: Home / Self Care | Payer: Self-pay | Attending: Emergency Medicine | Admitting: Emergency Medicine

## 2020-06-05 ENCOUNTER — Other Ambulatory Visit: Payer: Self-pay

## 2020-06-05 DIAGNOSIS — F17210 Nicotine dependence, cigarettes, uncomplicated: Secondary | ICD-10-CM | POA: Insufficient documentation

## 2020-06-05 DIAGNOSIS — M545 Low back pain, unspecified: Secondary | ICD-10-CM | POA: Insufficient documentation

## 2020-06-05 HISTORY — DX: Other chronic pain: G89.29

## 2020-06-05 MED ORDER — NAPROXEN 500 MG PO TABS: 500.0000 mg | ORAL_TABLET | Freq: Two times a day (BID) | ORAL | 0 refills | Status: AC | PRN

## 2020-06-05 MED ORDER — NAPROXEN 250 MG PO TABS
500.0000 mg | ORAL_TABLET | Freq: Once | ORAL | Status: AC
  Administered 2020-06-05: 500 mg via ORAL
  Filled 2020-06-05: qty 2

## 2020-06-05 MED ORDER — CYCLOBENZAPRINE HCL 10 MG PO TABS
10.0000 mg | ORAL_TABLET | Freq: Once | ORAL | Status: AC
  Administered 2020-06-05: 10 mg via ORAL
  Filled 2020-06-05: qty 1

## 2020-06-05 MED ORDER — CYCLOBENZAPRINE HCL 10 MG PO TABS: 10.0000 mg | ORAL_TABLET | Freq: Three times a day (TID) | ORAL | 0 refills | Status: AC | PRN

## 2020-06-05 NOTE — ED Provider Notes (Signed)
[facility] Provider Note: Larry Dell, MD, FACEP  CSN: [id number] MRN: [id number] ARRIVAL: 06/05/20 at 0355 ROOM: [facility]09/[facility]09   CHIEF COMPLAINT  Back Pain   HISTORY OF PRESENT ILLNESS  06/05/20 4:02 AM General Larry Ross is a 29 y.o. male with a history of recurrent back pain for the past 2 years.  He is here with another episode that began yesterday evening.  There was no new injury or other inciting event.  The pain is located in the right lower back.  It is sharp and spasm-like in nature and worse with movement.  He has not taken anything for it.  It does not radiate down his leg.  There is no associated numbness, weakness, bowel or bladder change.   History reviewed. No pertinent past medical history.  Past Surgical History:  Procedure Laterality Date  . SKIN BIOPSY      Family History  Problem Relation Age of Onset  . Asthma Neg Hx     Social History   Tobacco Use  . Smoking status: Current Some Day Smoker    Packs/day: 0.25    Years: 0.50    Pack years: 0.12    Types: Cigarettes  . Smokeless tobacco: Never Used  . Tobacco comment: hookah occ   Substance Use Topics  . Alcohol use: Yes    Comment: "on weekends"  . Drug use: No    Prior to Admission medications   Medication Sig Start Date End Date Taking? Authorizing Provider  albuterol (VENTOLIN HFA) 108 (90 BASE) MCG/ACT inhaler Inhale 2 puffs into the lungs every 4 (four) hours as needed for wheezing or shortness of breath.    [provider]  cyclobenzaprine (FLEXERIL) 10 MG tablet Take 1 tablet (10 mg total) by mouth 3 (three) times daily as needed for muscle spasms. 06/05/20   Liesl Simons, MD  naproxen (NAPROSYN) 500 MG tablet Take 1 tablet (500 mg total) by mouth 2 (two) times daily as needed (for back pain). 06/05/20   Katelinn Justice, MD  nortriptyline (PAMELOR) 25 MG capsule Take 1 capsule (25 mg total) by mouth at bedtime. 04/17/18   Hudnall, Azucena Fallen, MD  budesonide-formoterol  (SYMBICORT) 80-4.5 MCG/ACT inhaler Take 2 puffs first thing in am and then another 2 puffs about 12 hours later. Patient not taking: Reported on 04/17/2018 10/20/13 06/05/20  Nyoka Cowden, MD    Allergies Patient has no known allergies.   REVIEW OF SYSTEMS  Negative except as noted here or in the History of Present Illness.   PHYSICAL EXAMINATION  Initial Vital Signs Blood pressure 112/77, pulse 77, temperature 98 F (36.7 C), temperature source Oral, resp. rate 18, height 6\' 1"  (1.854 m), weight 72.6 kg, SpO2 98 %.  Examination General: Well-developed, well-nourished male in no acute distress; appearance consistent with age of record HENT: normocephalic; atraumatic Eyes: Normal appearance Neck: supple Heart: regular rate and rhythm Lungs: clear to auscultation bilaterally Abdomen: soft; nondistended; nontender; bowel sounds present Back: Right SI tenderness; pain on movement of lower back Extremities: No deformity; full range of motion; pulses normal Neurologic: Awake, alert and oriented; motor function intact in all extremities and symmetric; no facial droop Skin: Warm and dry Psychiatric: Normal mood and affect   RESULTS  Summary of this visit's results, reviewed and interpreted by myself:   EKG Interpretation  Date/Time:    Ventricular Rate:    PR Interval:    QRS Duration:   QT Interval:    QTC Calculation:  R Axis:     Text Interpretation:        Laboratory Studies: No results found for this or any previous visit (from the past 24 hour(s)). Imaging Studies: No results found.  ED COURSE and MDM  Nursing notes, initial and subsequent vitals signs, including pulse oximetry, reviewed and interpreted by myself.  Vitals:   06/05/20 0357 06/05/20 0400  BP:  112/77  Pulse:  77  Resp:  18  Temp:  98 F (36.7 C)  TempSrc:  Oral  SpO2:  98%  Weight: 72.6 kg   Height: 6\' 1"  (1.854 m)    Medications  cyclobenzaprine (FLEXERIL) tablet 10 mg (has no  administration in time range)  naproxen (NAPROSYN) tablet 500 mg (has no administration in time range)    We will treat with Flexeril and naproxen.  He has got relief with Flexeril in the past.  He is also been given steroid tapers in the past but more recent literature suggests steroids or not first-line treatments for acute low back pain.   PROCEDURES  Procedures   ED DIAGNOSES     ICD-10-CM   1. Acute right-sided low back pain without sciatica  M54.50        Jayli Fogleman, , MD 06/05/20 [phone]

## 2020-06-07 ENCOUNTER — Encounter (HOSPITAL_BASED_OUTPATIENT_CLINIC_OR_DEPARTMENT_OTHER): Payer: Self-pay | Admitting: Emergency Medicine

## 2020-06-07 ENCOUNTER — Other Ambulatory Visit: Payer: Self-pay

## 2020-06-07 ENCOUNTER — Emergency Department (HOSPITAL_BASED_OUTPATIENT_CLINIC_OR_DEPARTMENT_OTHER)
Admission: EM | Admit: 2020-06-07 | Discharge: 2020-06-07 | Disposition: A | Discharge status: Home / Self Care | Payer: Self-pay | Attending: Emergency Medicine | Admitting: Emergency Medicine

## 2020-06-07 DIAGNOSIS — G8929 Other chronic pain: Secondary | ICD-10-CM

## 2020-06-07 DIAGNOSIS — J45909 Unspecified asthma, uncomplicated: Secondary | ICD-10-CM | POA: Insufficient documentation

## 2020-06-07 DIAGNOSIS — M5441 Lumbago with sciatica, right side: Secondary | ICD-10-CM | POA: Insufficient documentation

## 2020-06-07 DIAGNOSIS — F17210 Nicotine dependence, cigarettes, uncomplicated: Secondary | ICD-10-CM | POA: Insufficient documentation

## 2020-06-07 DIAGNOSIS — Z79899 Other long term (current) drug therapy: Secondary | ICD-10-CM | POA: Insufficient documentation

## 2020-06-07 MED ORDER — TRAMADOL HCL 50 MG PO TABS: ORAL_TABLET | ORAL | 0 refills | Status: AC

## 2020-06-07 MED ORDER — DEXAMETHASONE SODIUM PHOSPHATE 10 MG/ML IJ SOLN
10.0000 mg | Freq: Once | INTRAMUSCULAR | Status: AC
  Administered 2020-06-07: 10 mg via INTRAMUSCULAR
  Filled 2020-06-07: qty 1

## 2020-06-07 NOTE — Discharge Instructions (Signed)
1.  You have been given a shot of Decadron in the emergency department.  This is a steroid to help with pain in your back.  You may also take extra strength Tylenol (acetaminophen), 2 tablets per package instruction every 6 hours to help with pain.  You have also been given a prescription for tramadol.  You may take 1-2 these tablets every 6 hours to help with pain not relieved by acetaminophen.  Acetaminophen and tramadol may be taken safely together.  At this time, since you have been given a steroid shot, discontinue your naproxen for the next week.  You may also continue your prescribed Flexeril if needed for muscle spasm. 2.  Since you have had recurrent problems with back pain for several years, you should schedule an appointment to be seen by a neurosurgeon and spine specialist.  You have been given contact information your discharge instructions. 3.  Return for worsening symptoms or concerning changes per your discharge instructions regarding sciatica.

## 2020-06-07 NOTE — ED Triage Notes (Addendum)
Seen for same 2 days ago here states meds given not helping Back pain since 2019 but no new injury he states  Lower back towards the butt he states  Hurts to move , walk  Denies bowel or bladder issues  Achy pain

## 2020-06-07 NOTE — ED Provider Notes (Signed)
[facility] EMERGENCY DEPARTMENT Provider Note   CSN: [id number] Arrival date & time: 06/07/20  0751     History Chief Complaint  Patient presents with  . Back Pain    Larry Ross is a 29 y.o. male.  HPI Patient reports for over 2 years now he will get flareups of lower back pain.  Pain is usually at the top of his buttocks.  He reports currently it is at the top of his buttocks on the right.  He indicates the SI region.  He reports if he moves a certain way with the right leg he will get severe pain that will come down to the hip part way down the leg.  He denies he has numbness or weakness into the leg but the amount of pain he experiences at times prevents him from walking normally.  He reports that he can go sometimes 7 to 8 months, pain-free.  He reports then for no specific reason he cannot identify, he will get a flareup.  He thinks it seems to occur more in the winter and if he is under stress.  He denies any specific injury.  He does not experience other joints that become sore, inflamed or swollen.  He reports other than this issue of recurrent back pain, he feels well.  He denies any difficulty with bowel or bladder control.  He is urinating normally without pain.  Patient was seen in the emergency department and prescribed naproxen and Flexeril.  He reports this does not seem to be helping much.  He reports he was treated 1 time with a shot in his gluteus.  He reports after about 2 hours the pain was significantly improved.  He reports sometimes a certain medication will really eliminate it pretty quickly.  This time however the naproxen and Flexeril are not helping much.  He thinks has had steroid treatment before but he cannot recall the last time.  Patient was seen by Dr. Pearletha Forge in sports medicine for this condition and an MRI was done in 2019.  Patient reports after treatment with Dr. Pearletha Forge, he did have a prolonged period of improvement.  He denies ever  having been seen by a neurosurgeon or spine specialist.  Patient has no current or historical use of IV drug abuse.    Past Medical History:  Diagnosis Date  . Chronic back pain     Patient Active Problem List   Diagnosis Date Noted  . 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 Smoker 09/23/2013    Past Surgical History:  Procedure Laterality Date  . SKIN BIOPSY         Family History  Problem Relation Age of Onset  . Asthma Neg Hx     Social History   Tobacco Use  . Smoking status: Current Some Day Smoker    Packs/day: 0.25    Years: 0.50    Pack years: 0.12    Types: Cigarettes  . Smokeless tobacco: Never Used  . Tobacco comment: hookah occ   Substance Use Topics  . Alcohol use: Yes    Comment: "on weekends"  . Drug use: Yes    Types: Marijuana    Home Medications Prior to Admission medications   Medication Sig Start Date End Date Taking? Authorizing Provider  albuterol (VENTOLIN HFA) 108 (90 BASE) MCG/ACT inhaler Inhale 2 puffs into the lungs every 4 (four) hours as needed for wheezing or shortness of breath.    [provider]  cyclobenzaprine (FLEXERIL) 10  MG tablet Take 1 tablet (10 mg total) by mouth 3 (three) times daily as needed for muscle spasms. 06/05/20   Molpus, John, MD  naproxen (NAPROSYN) 500 MG tablet Take 1 tablet (500 mg total) by mouth 2 (two) times daily as needed (for back pain). 06/05/20   Molpus, John, MD  nortriptyline (PAMELOR) 25 MG capsule Take 1 capsule (25 mg total) by mouth at bedtime. 04/17/18   Hudnall, Azucena Fallen, MD  traMADol (ULTRAM) 50 MG tablet 1 to 2 tablets every 6 hours for back pain. 06/07/20   Arby Barrette, MD  budesonide-formoterol (SYMBICORT) 80-4.5 MCG/ACT inhaler Take 2 puffs first thing in am and then another 2 puffs about 12 hours later. Patient not taking: Reported on 04/17/2018 10/20/13 06/05/20  Nyoka Cowden, MD    Allergies    Patient has no known allergies.  Review of Systems   Review of Systems 10 systems  reviewed and negative except as per HPI Physical Exam Updated Vital Signs BP 113/75 (BP Location: Right Arm)   Pulse 75   Temp (!) 97.5 F (36.4 C) (Oral)   Resp 19   Ht 6\' 1"  (1.854 m)   Wt 72.6 kg   SpO2 99%   BMI 21.11 kg/m   Physical Exam Constitutional:      Appearance: Normal appearance.     Comments: Well-nourished well-developed.  Clinically well in appearance.  HENT:     Mouth/Throat:     Pharynx: Oropharynx is clear.  Cardiovascular:     Rate and Rhythm: Normal rate and regular rhythm.     Heart sounds: Normal heart sounds.  Pulmonary:     Effort: Pulmonary effort is normal.     Breath sounds: Normal breath sounds.  Abdominal:     General: There is no distension.     Palpations: Abdomen is soft.     Tenderness: There is no abdominal tenderness. There is no guarding.  Musculoskeletal:        General: No swelling, tenderness, deformity or signs of injury.     Right lower leg: No edema.     Left lower leg: No edema.     Comments: Pain is reproduced by forward flexion in the bed.  Also some discomfort to palpation over the SI joint on the right.  Both limbs are in good physical condition.  No atrophy.  No peripheral edema.  Skin condition and musculature is normal.  Patient has intact neurologic use of the right lower extremity.  He can spontaneously move and reposition the limb.  No motor deficit.  Neurological:     General: No focal deficit present.     Mental Status: He is alert and oriented to person, place, and time.     Coordination: Coordination normal.  Psychiatric:        Mood and Affect: Mood normal.     ED Results / Procedures / Treatments   Labs (all labs ordered are listed, but only abnormal results are displayed) Labs Reviewed - No data to display  EKG None  Radiology No results found.  Procedures Procedures (including critical care time)  Medications Ordered in ED Medications  dexamethasone (DECADRON) injection 10 mg (has no  administration in time range)    ED Course  I have reviewed the triage vital signs and the nursing notes.  Pertinent labs & imaging results that were available during my care of the patient were reviewed by me and considered in my medical decision making (see chart for details).  MDM Rules/Calculators/A&P                          Patient alert and clinically well.  He has had periodic exacerbations of lower back pain with a sciatica type pattern for over 2 years.  Neurologic deficit.  No bowel bladder dysfunction to suggest cauda equina.  Patient had grossly normal MRI 2 years ago.  Patient does not have risk factors for spinal abscess or discitis.  He is otherwise well.  He does not have other inflammatory symptoms on review of systems to suggest autoimmune disorder.  Patient describes good relief with steroid use on a previous treatment episode.  At this time, will opt for a dose of Decadron and then symptomatic treatment with acetaminophen and tramadol.  Patient denies getting relief currently on naproxen and Flexeril.  I recommend the patient follow-up with spine specialist due to recurrent, sciatica type back pain over 2 years duration.  This time, I do not think patient needs emergent MRI.  He is neurologically intact with previous MRI at 2 years without abnormality. Final Clinical Impression(s) / ED Diagnoses Final diagnoses:  Chronic right-sided low back pain with right-sided sciatica    Rx / DC Orders ED Discharge Orders         Ordered    traMADol (ULTRAM) 50 MG tablet        06/07/20 2353           Arby Barrette, MD 06/07/20 [phone]

## 2020-06-07 NOTE — ED Notes (Signed)
Review D/C papers with pt, reviewed Rx with pt, pt states understanding, pt denies questions at this time. 

## 2020-06-21 ENCOUNTER — Telehealth: Payer: Self-pay | Admitting: Family Medicine

## 2020-06-21 NOTE — Telephone Encounter (Signed)
Unsuccessful attempt to contact pt for schedule-- Ed f/u appt--glh

## 2020-08-16 ENCOUNTER — Encounter (HOSPITAL_BASED_OUTPATIENT_CLINIC_OR_DEPARTMENT_OTHER): Payer: Self-pay | Admitting: Emergency Medicine

## 2020-08-16 ENCOUNTER — Emergency Department (HOSPITAL_BASED_OUTPATIENT_CLINIC_OR_DEPARTMENT_OTHER)
Admission: EM | Admit: 2020-08-16 | Discharge: 2020-08-16 | Disposition: A | Discharge status: Home / Self Care | Payer: Self-pay | Attending: Emergency Medicine | Admitting: Emergency Medicine

## 2020-08-16 ENCOUNTER — Other Ambulatory Visit: Payer: Self-pay

## 2020-08-16 DIAGNOSIS — G8929 Other chronic pain: Secondary | ICD-10-CM | POA: Insufficient documentation

## 2020-08-16 DIAGNOSIS — M545 Low back pain, unspecified: Secondary | ICD-10-CM | POA: Insufficient documentation

## 2020-08-16 DIAGNOSIS — Z5321 Procedure and treatment not carried out due to patient leaving prior to being seen by health care provider: Secondary | ICD-10-CM | POA: Insufficient documentation

## 2020-08-16 NOTE — ED Notes (Signed)
Pt cannot be found

## 2020-08-16 NOTE — ED Notes (Signed)
Pt not answering

## 2020-08-16 NOTE — ED Triage Notes (Signed)
States has chronic back pain and it started to hurt  This weekend no new injury

## 2022-11-07 ENCOUNTER — Encounter: Payer: Self-pay | Admitting: *Deleted
